# Patient Record
Sex: Male | Born: 1965 | Race: Black or African American | Hispanic: No | Marital: Married | Smoking: Current every day smoker
Health system: Southern US, Community
[De-identification: ages and names within clinical notes are randomized; demographics above are authoritative.]

## PROBLEM LIST (undated history)

## (undated) DIAGNOSIS — F1020 Alcohol dependence, uncomplicated: Secondary | ICD-10-CM

## (undated) DIAGNOSIS — K4090 Unilateral inguinal hernia, without obstruction or gangrene, not specified as recurrent: Secondary | ICD-10-CM

## (undated) DIAGNOSIS — C801 Malignant (primary) neoplasm, unspecified: Secondary | ICD-10-CM

## (undated) DIAGNOSIS — K4021 Bilateral inguinal hernia, without obstruction or gangrene, recurrent: Secondary | ICD-10-CM

## (undated) DIAGNOSIS — J189 Pneumonia, unspecified organism: Secondary | ICD-10-CM

## (undated) DIAGNOSIS — I10 Essential (primary) hypertension: Secondary | ICD-10-CM

## (undated) DIAGNOSIS — F1011 Alcohol abuse, in remission: Secondary | ICD-10-CM

---

## 2000-06-06 ENCOUNTER — Emergency Department (HOSPITAL_COMMUNITY): Admission: EM | Admit: 2000-06-06 | Discharge: 2000-06-06 | Payer: Self-pay | Admitting: Emergency Medicine

## 2003-05-06 ENCOUNTER — Emergency Department (HOSPITAL_COMMUNITY): Admission: EM | Admit: 2003-05-06 | Discharge: 2003-05-06 | Payer: Self-pay

## 2003-12-19 ENCOUNTER — Emergency Department (HOSPITAL_COMMUNITY): Admission: EM | Admit: 2003-12-19 | Discharge: 2003-12-19 | Payer: Self-pay | Admitting: Emergency Medicine

## 2004-04-28 ENCOUNTER — Emergency Department (HOSPITAL_COMMUNITY): Admission: EM | Admit: 2004-04-28 | Discharge: 2004-04-29 | Payer: Self-pay | Admitting: Emergency Medicine

## 2004-08-16 ENCOUNTER — Emergency Department (HOSPITAL_COMMUNITY): Admission: EM | Admit: 2004-08-16 | Discharge: 2004-08-17 | Payer: Self-pay | Admitting: Emergency Medicine

## 2006-08-27 ENCOUNTER — Inpatient Hospital Stay (HOSPITAL_COMMUNITY): Admission: EM | Admit: 2006-08-27 | Discharge: 2006-09-11 | Payer: Self-pay | Admitting: Family Medicine

## 2006-08-28 ENCOUNTER — Ambulatory Visit: Payer: Self-pay | Admitting: Thoracic Surgery

## 2006-08-30 ENCOUNTER — Encounter: Payer: Self-pay | Admitting: Cardiothoracic Surgery

## 2006-09-02 DIAGNOSIS — Z9889 Other specified postprocedural states: Secondary | ICD-10-CM | POA: Insufficient documentation

## 2006-09-13 ENCOUNTER — Ambulatory Visit: Payer: Self-pay | Admitting: *Deleted

## 2006-09-21 ENCOUNTER — Ambulatory Visit: Payer: Self-pay | Admitting: Cardiothoracic Surgery

## 2006-09-21 ENCOUNTER — Encounter: Admission: RE | Admit: 2006-09-21 | Discharge: 2006-09-21 | Payer: Self-pay | Admitting: Cardiothoracic Surgery

## 2006-09-26 ENCOUNTER — Encounter: Admission: RE | Admit: 2006-09-26 | Discharge: 2006-09-26 | Payer: Self-pay | Admitting: Cardiothoracic Surgery

## 2006-09-26 ENCOUNTER — Ambulatory Visit: Payer: Self-pay | Admitting: Cardiothoracic Surgery

## 2006-10-03 ENCOUNTER — Ambulatory Visit: Payer: Self-pay | Admitting: Internal Medicine

## 2006-11-09 ENCOUNTER — Encounter: Admission: RE | Admit: 2006-11-09 | Discharge: 2006-11-09 | Payer: Self-pay | Admitting: Cardiothoracic Surgery

## 2006-11-09 ENCOUNTER — Ambulatory Visit: Payer: Self-pay | Admitting: Cardiothoracic Surgery

## 2006-12-05 DIAGNOSIS — J869 Pyothorax without fistula: Secondary | ICD-10-CM | POA: Insufficient documentation

## 2006-12-05 DIAGNOSIS — F1010 Alcohol abuse, uncomplicated: Secondary | ICD-10-CM | POA: Insufficient documentation

## 2006-12-05 DIAGNOSIS — F17200 Nicotine dependence, unspecified, uncomplicated: Secondary | ICD-10-CM | POA: Insufficient documentation

## 2006-12-17 ENCOUNTER — Ambulatory Visit: Payer: Self-pay | Admitting: Cardiothoracic Surgery

## 2007-01-24 ENCOUNTER — Encounter: Payer: Self-pay | Admitting: Internal Medicine

## 2007-10-02 ENCOUNTER — Ambulatory Visit: Payer: Self-pay | Admitting: Critical Care Medicine

## 2007-10-02 ENCOUNTER — Inpatient Hospital Stay (HOSPITAL_COMMUNITY): Admission: EM | Admit: 2007-10-02 | Discharge: 2007-10-04 | Payer: Self-pay | Admitting: Emergency Medicine

## 2007-10-31 IMAGING — CR DG CHEST 2V
2 series · 2 of 2 positions shown · non-contrast
Comparison: [HOSPITAL] chest x-ray, 09/11/06.

CLINICAL DATA: Shortness of breath post left thoracotomy/empyema.
CHEST - TWO VIEWS:

[w chest pa]
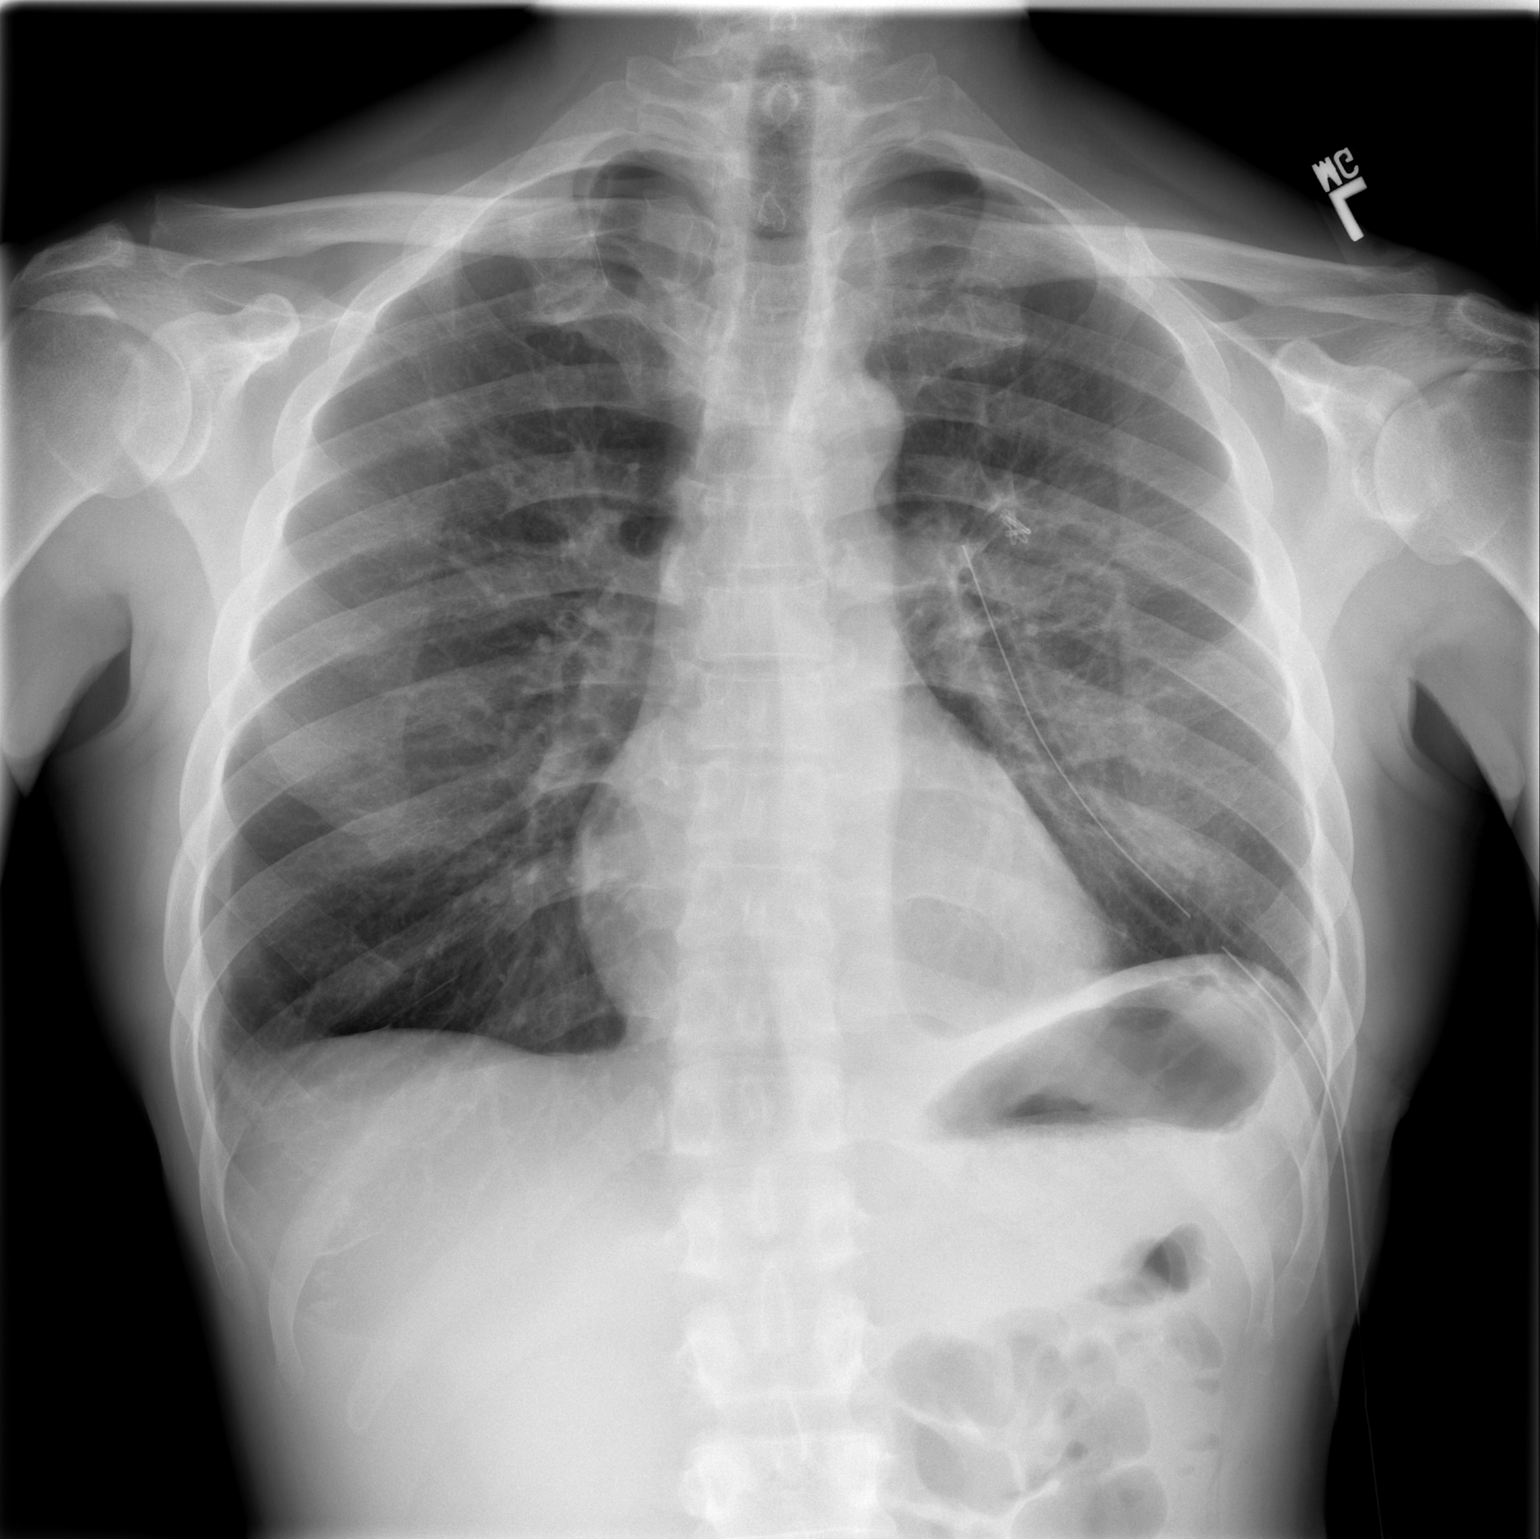

[w chest lat]
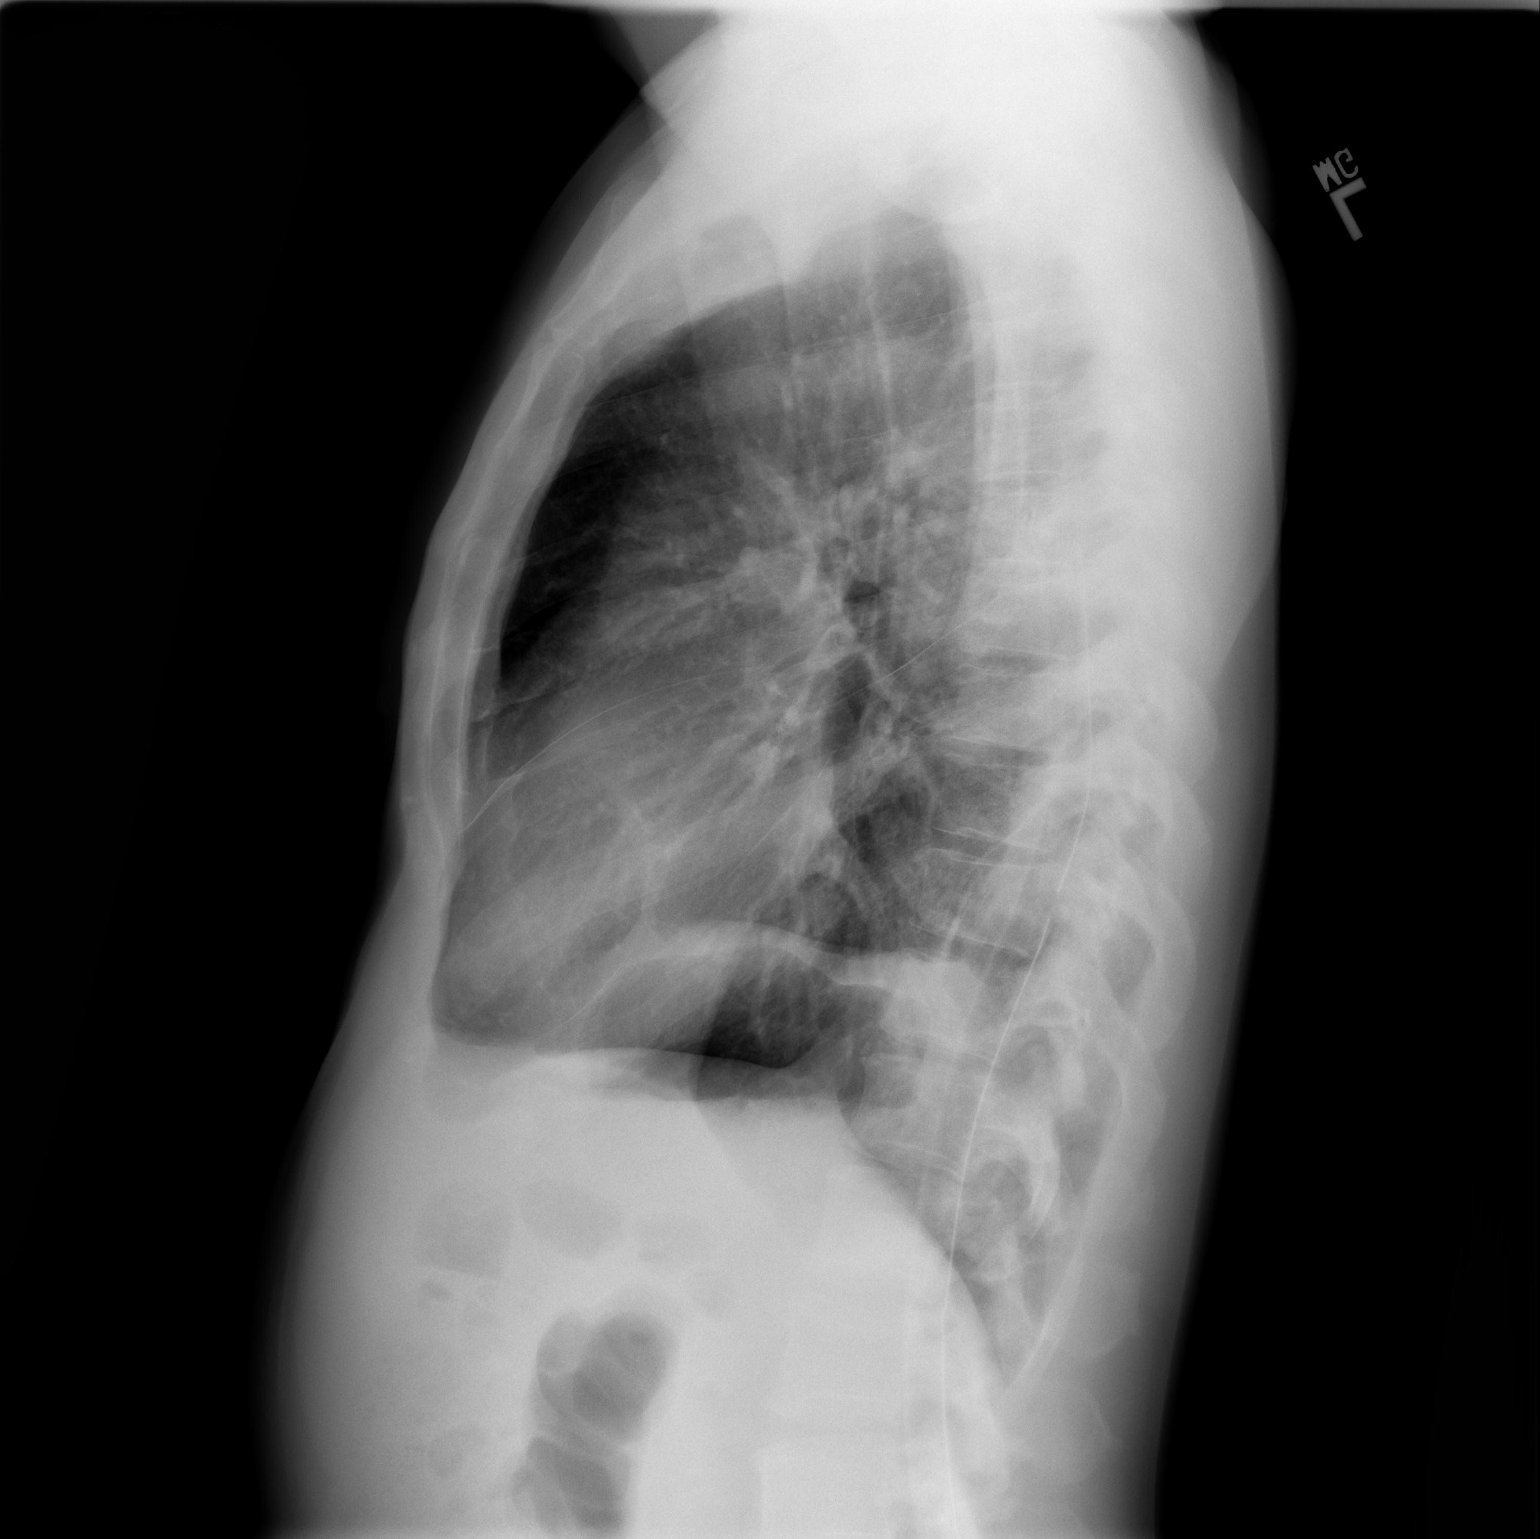

[2 of 2 positions shown; findings below may reference images not displayed]

FINDINGS: Stable left chest tube position is seen with left posterior upper lobe surgical clips.  Vague sheet-like opacity at the left hemithorax is seen with lungs otherwise clear without pneumothorax.  Interval clearing is seen at left lung base specifically.  Right lung clear.  Heart size normal.
IMPRESSION: 1.  Interval clearing, left lower lobe.
2.  Stable left hemithoracic postoperative changes and probable mild diffuse pleural thickening without significant pleural fluid nor pneumothorax.
3.  Otherwise no active disease.

## 2007-11-05 IMAGING — CR DG CHEST 2V
2 series · 2 of 2 positions shown · non-contrast
Comparison: 09/21/2006.

Exam: Chest, 2 views.

HISTORY: Status post left thoracotomy for empyema.

[w chest pa]
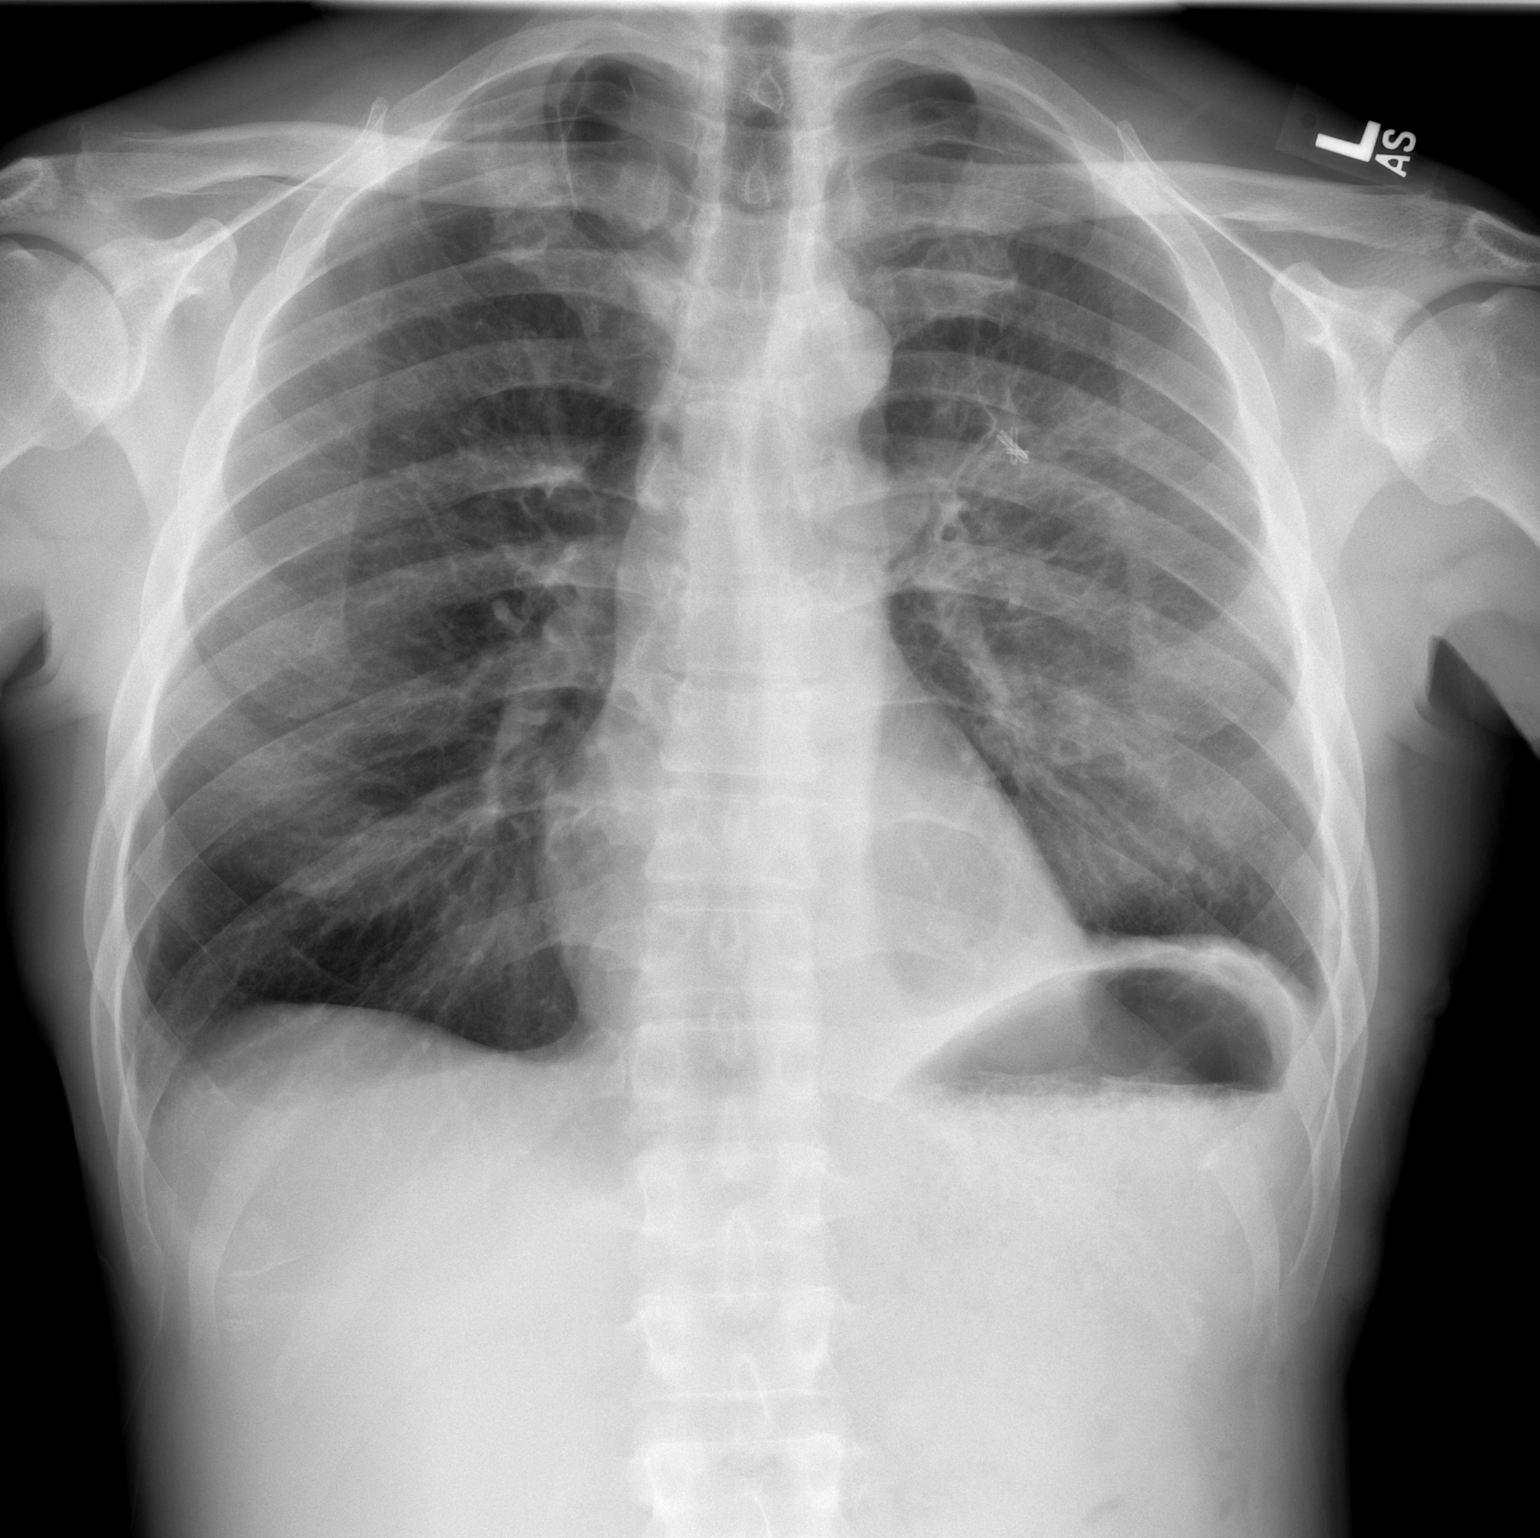

[w chest lat]
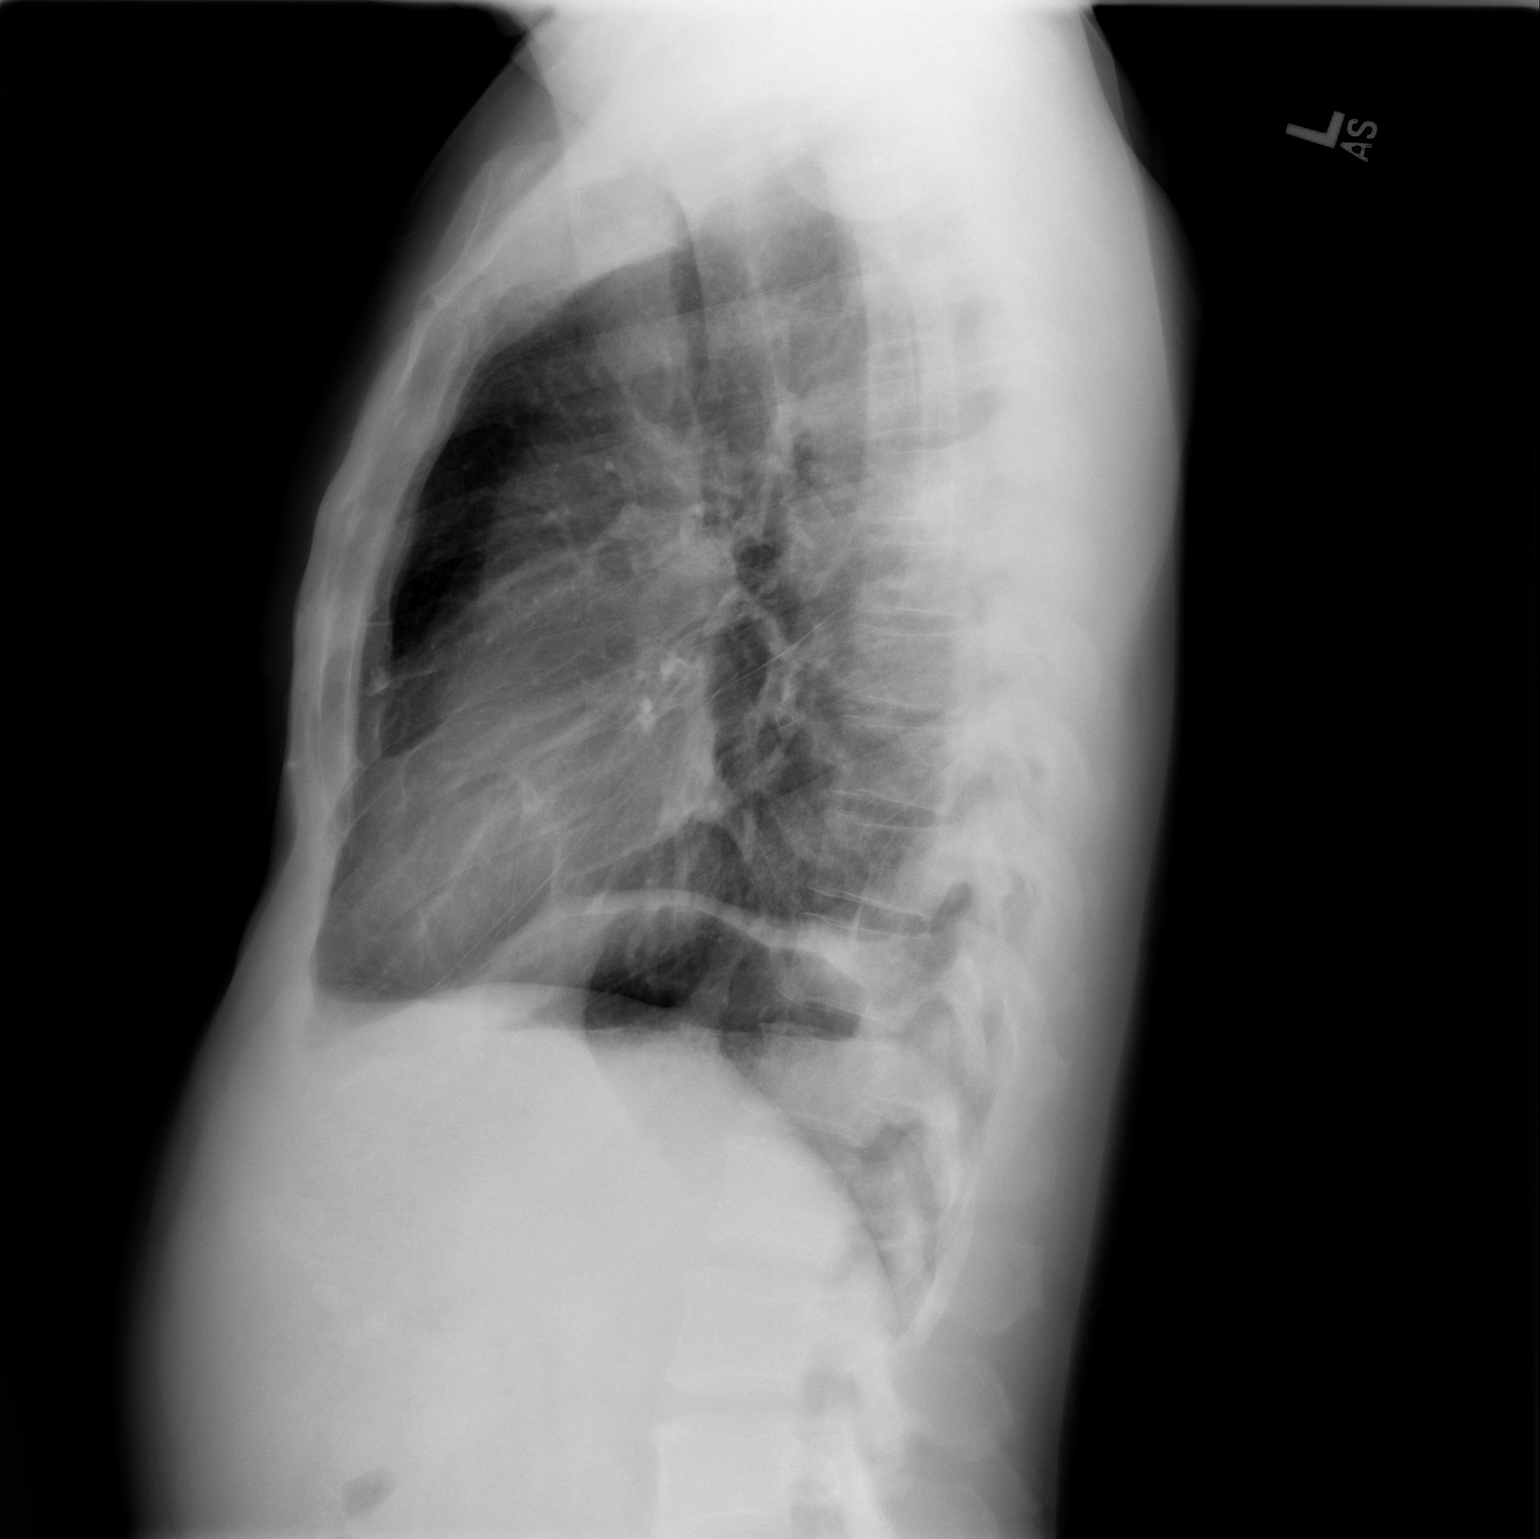

[2 of 2 positions shown; findings below may reference images not displayed]

FINDINGS: Heart size is normal.

Since the prior exam is no interval removal of left-sided chest tube. There is
no pneumothorax identified.

Small amount of pleural fluid versus pleural thickening is identified
bilaterally. This is similar in appearance to the prior exam.

A surgical clip is identified within the left upper lobe. 

No acute air space opacities identified.
IMPRESSION: 1. Mild residual pleural fluid/thickening.

## 2008-12-03 ENCOUNTER — Emergency Department (HOSPITAL_COMMUNITY): Admission: EM | Admit: 2008-12-03 | Discharge: 2008-12-03 | Payer: Self-pay | Admitting: Emergency Medicine

## 2009-03-27 HISTORY — PX: LUNG SURGERY: SHX703

## 2010-05-19 ENCOUNTER — Inpatient Hospital Stay (INDEPENDENT_AMBULATORY_CARE_PROVIDER_SITE_OTHER)
Admission: RE | Admit: 2010-05-19 | Discharge: 2010-05-19 | Disposition: A | Discharge status: Home / Self Care | Payer: Self-pay | Source: Ambulatory Visit | Attending: Family Medicine | Admitting: Family Medicine

## 2010-05-19 DIAGNOSIS — A64 Unspecified sexually transmitted disease: Secondary | ICD-10-CM

## 2010-07-01 LAB — DIFFERENTIAL
Basophils Absolute: 0 10*3/uL (ref 0.0–0.1)
Basophils Relative: 0 % (ref 0–1)
Eosinophils Relative: 0 % (ref 0–5)
Lymphocytes Relative: 10 % — ABNORMAL LOW (ref 12–46)
Neutro Abs: 7 10*3/uL (ref 1.7–7.7)

## 2010-07-01 LAB — URINALYSIS, ROUTINE W REFLEX MICROSCOPIC
Glucose, UA: 100 mg/dL — AB
Ketones, ur: 15 mg/dL — AB
pH: 5.5 (ref 5.0–8.0)

## 2010-07-01 LAB — HEPATIC FUNCTION PANEL
AST: 44 U/L — ABNORMAL HIGH (ref 0–37)
Bilirubin, Direct: 0.1 mg/dL (ref 0.0–0.3)
Total Bilirubin: 1 mg/dL (ref 0.3–1.2)

## 2010-07-01 LAB — POCT CARDIAC MARKERS: Myoglobin, poc: 118 ng/mL (ref 12–200)

## 2010-07-01 LAB — BASIC METABOLIC PANEL
BUN: 6 mg/dL (ref 6–23)
CO2: 29 mEq/L (ref 19–32)
Calcium: 9.7 mg/dL (ref 8.4–10.5)
Creatinine, Ser: 0.89 mg/dL (ref 0.4–1.5)
GFR calc Af Amer: >60 mL/min (ref >60)

## 2010-07-01 LAB — D-DIMER, QUANTITATIVE: D-Dimer, Quant: 0.63 ug/mL-FEU — ABNORMAL HIGH (ref 0.00–0.48)

## 2010-07-01 LAB — CBC
MCHC: 34.6 g/dL (ref 30.0–36.0)
MCV: 92.7 fL (ref 78.0–100.0)
Platelets: 129 10*3/uL — ABNORMAL LOW (ref 150–400)
RBC: 5.14 MIL/uL (ref 4.22–5.81)
RDW: 13.2 % (ref 11.5–15.5)

## 2010-07-01 LAB — URINE CULTURE

## 2010-07-01 LAB — RAPID URINE DRUG SCREEN, HOSP PERFORMED
Amphetamines: NOT DETECTED
Barbiturates: NOT DETECTED
Benzodiazepines: NOT DETECTED
Cocaine: NOT DETECTED

## 2010-07-01 LAB — LIPASE, BLOOD: Lipase: 33 U/L (ref 11–59)

## 2010-07-01 LAB — URINE MICROSCOPIC-ADD ON

## 2010-08-09 NOTE — Discharge Summary (Signed)
NAMEWILBON, Jonathon Hunt                ACCOUNT NO.:  [id number]   MEDICAL RECORD NO.:  [id number]          PATIENT TYPE:  INP   LOCATION:  2002                         FACILITY:  [facility]   PHYSICIAN:  Isidor Holts, M.D.  DATE OF BIRTH:  16-Apr-1965   DATE OF ADMISSION:  08/26/2006  DATE OF DISCHARGE:  09/11/2006                               DISCHARGE SUMMARY   ADDENDUM:  For discharge diagnoses and procedures, as well as  consultations, refer to interim discharge summary dated September 09, 2006.  In addition, for the subsequent period from September 10, 2006 to September 11, 2006, the patient's clinical condition remained stable. There were no  new issues. He has been seen on September 11, 2006 by Dr. Donata Clay, TCTS,  and has been okayed from the point of view of cardiothoracic surgery, to  be discharged. Certainly clinically, he has made impressive progress and  is practically recovered. The patient has therefore been discharged.   DISCHARGE MEDICATIONS:  1. Augmentin 875 mg p.o. b.i.d. for 6 weeks.  2. Thiamine 100 mg p.o. daily.  3. Percocet (5/325) 1 p.o. p.r.n. q.6h.; a total of 40 pills have been      dispensed.   DIET:  No restrictions.   ACTIVITY:  Recommended to increase activity slowly.   FOLLOWUP INSTRUCTIONS:  The patient is recommended to follow up with Dr.  Donata Clay, TCTS, after one week. He is also recommended to establish and  follow up with a primary M.D. for routine preventative care.   SPECIAL INSTRUCTIONS:  Home health PT/OT R.N. has been arranged. The  patient has been recommended to remain off work until reevaluated and  okayed by Dr. Donata Clay, TCTS. All of this has been communicated to  patient who verbalized understanding.      Isidor Holts, M.D.  Electronically Signed     CO/MEDQ  D:  09/11/2006  T:  09/11/2006  Job:  [id number]   cc:   Kerin Perna, M.D.

## 2010-08-09 NOTE — Assessment & Plan Note (Signed)
OFFICE VISIT   THEOPOLIS, SLOOP  DOB:  [birth year]                                        September 21, 2006  CHART #:  [id number]   CURRENT PROBLEMS:  1. Left empyema status post left VATS and drainage with decortication      on 08/30/2006.  2. Chronic obstructive pulmonary disease.   CURRENT MEDICATIONS:  1. Augmentin 875 mg p.o. b.i.d.  2. Thiamine 100 mg daily.  3. Percocet p.r.n. pain.   PRESENT ILLNESS:  The patient returns for an office visit. He was  discharged from the hospital with an empyema tube intact connected to a  mini express drainage system. He has had no fever and has had usual post-  thoracotomy pain. He is eating well and is gaining strength. The chest  tube had minimal drainage over the ten days and I see no air leak on  exam.   PHYSICAL EXAMINATION:  Vital Signs: Blood pressure 130/60, pulse 80,  respirations 18, saturation 98%. Chest: Breath sounds are clear. The  left thoracotomy incision is well healed. The left chest tube is removed  and a compressive dressing applied. There is good breath sounds  following removal of the chest tube. Cardiac rhythm is regular.  Neurologic: No deficit.   LABORATORY DATA:  Lateral chest x-ray prior to the office visit today  shows clear lung fields, no effusion or residual infiltrate.   IMPRESSION:  Improved course following decortication and drainage of  empyema. He will finish his oral antibiotics and I will see him back in  five days with a chest x-ray, and to remove the chest tube sutures.   Kerin Perna, M.D.  Electronically Signed   PV/MEDQ  D:  09/21/2006  T:  09/22/2006  Job:  [id number]

## 2010-08-09 NOTE — Consult Note (Signed)
NAMESUSANO, CLECKLER                ACCOUNT NO.:  [id number]   MEDICAL RECORD NO.:  [id number]          PATIENT TYPE:  INP   LOCATION:  5731                         FACILITY:  [facility]   PHYSICIAN:  Ines Bloomer, M.D. DATE OF BIRTH:  1966/02/02   DATE OF CONSULTATION:  DATE OF DISCHARGE:                                 CONSULTATION   CHIEF COMPLAINT:  Left chest pain.   HISTORY OF PRESENT ILLNESS:  A 45 year old patient started developing  three to four weeks of fever and chills and left chest pain with a  chronic cough.  He also had some decreased appetite and weight loss.  He  went to urgent care after using over-the-counter medications.  A chest x-  ray revealed an area of elevated white count and opacified left chest.  He was admitted to the hospital, underwent a CT scan which shows a  loculated left pleural effusion with two areas of loculation.  His white  count is 16,000.  He denies no fever, chills, nausea or vomiting.   PAST MEDICAL HISTORY:  Unremarkable.  He apparently had a needle removed  from one toe.   He is on no medications, no allergies on admission, now on Lovenox,  Vancomycin and Fortaz.   FAMILY HISTORY:  Positive for cancer and vascular disease.   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   REVIEW OF SYSTEMS:  He has had some weight loss.  CARDIAC:  No angina or  atrial fibrillation.  PULMONARY:  No hemoptysis.  See History of Present  Illness.  GI:  No nausea, vomiting, constipation or diarrhea.  GU:  No  dysuria, frequent urination or kidney disease.  VASCULAR:  No  claudication, DVT, TIAs.  NEUROLOGICAL:  No headaches, blackouts or  seizures.  MUSCULOSKELETAL:  No joint pain, rash.  HEMATOLOGIC:  No  problems with kidney disease or bleeding.  ENDOCRINE:  No history of  adrenal insufficiency or thyroid disease.  PSYCHIATRIC:  No psychiatric  illnesses.   PHYSICAL EXAMINATION:  VITAL SIGNS:  His temp is 100, sats are 99%,  blood pressure is 118/17, pulse 100, respirations 20.  HEENT:  Unremarkable.  NECK:  Supple without thyromegaly.  There is no supraclavicular axillary  adenopathy.  CHEST:  Decreased breath sounds on the left.  HEART:  Regular sinus rhythm.  No murmurs.  ABDOMEN:  Soft.  There is no hepatosplenomegaly.  Bowel sounds are  normal.  EXTREMITIES:  Pulses 2+.  There is no clubbing or edema.  NEUROLOGIC:  Intact.  Oriented x3.   IMPRESSION:  1. Loculated left pleural effusion, probable empyema.  2. Probable pneumonia.  3. History of tobacco abuse.   PLAN:  Culture and drainage with a VATS approach.      Ines Bloomer, M.D.  Electronically Signed     DPB/MEDQ  D:  08/28/2006  T:  08/28/2006  Job:  [id number]

## 2010-08-09 NOTE — Discharge Summary (Signed)
Jonathon Hunt, Jonathon Hunt                ACCOUNT NO.:  [id number]   MEDICAL RECORD NO.:  [id number]          PATIENT TYPE:  INP   LOCATION:  5149                         FACILITY:  [facility]   PHYSICIAN:  Nelda Bucks, MD DATE OF BIRTH:  18-Jul-1965   DATE OF ADMISSION:  10/02/2007  DATE OF DISCHARGE:  10/04/2007                               DISCHARGE SUMMARY   DISCHARGE DIAGNOSES:  1. Acute alcohol intoxication with acute respiratory event requiring      intubation.  2. Altered mental status.  3. History of empyema and drainage with left VATS June 2008.   HISTORY OF PRESENT ILLNESS:  Mr. Jonathon Hunt is a 45 year old African  American male who presented to the emergency department combative.  He  required sedation and intubation for radiographic evaluation secondary  to multiple abrasions about his face.  Pulmonary critical care was asked  to admit at that time.   LAB DATA:  Chest x-ray on July 10 demonstrates extubation and removal of  NG tube with mid left air space disease improved.  CT of the abdomen,  pelvis, cervical spine demonstrates mild generalized cerebral and  cerebellar blood loss.  No acute intracranial abnormalities.  Further  lab data, hemoglobin 14.4, hematocrit 41.6, WBCs 15.5, platelets are  156, sodium 134, potassium 3.6, chloride 99, CO2 26, BUN 5, creatinine  0.86, glucose is 104.  AST is 99, ALT is 84, alkaline phosphatase is 50,  total bilirubin 0.7, albumin 3.8, calcium 9.2, magnesium 2.3.  Acetaminophen level is less than 10.  Alcohol level on July 9 was 83 and  on day of admission was 549.  Drugs of abuse were negative.  Arterial  blood gas pH 7.43, pCO2 of 41, pO2 of 513 on 100% FIO2.   HOSPITAL COURSE BY DISCHARGE DIAGNOSES:  1. Acute respiratory failure secondary to altered mental status with      alcohol level 549 and with IV Ativan.  He required orotracheal      intubation, successful liberation from mechanical ventilatory      support within 18  hours.  He has no further respiratory difficulty.      His combative behavior has resolved.  2. Altered mental status.  Altered mental status resolved.  CT      evaluation of his head, neck and abdomen and pelvis was negative.      No further interventions required.  3. History of empyema and drainage with a left VATS June 2008.  No      interventions required.  4. Right facial abrasions secondary to fall.  Again, all radiographic      information is negative.  He has been instructed to use Neosporin      to his abrasions.  5. Alcohol abuse.  It has been suggested that he seek help with his      alcohol problem.  This would be left up to him at this time.   DISCHARGE MEDICATIONS:  None.   DIET:  Regular.   DISPOSITION AND CONDITION ON DISCHARGE:  His acute respiratory failure  and alcohol overload has been resolved.  He is being discharged  improved.      Devra Dopp, MSN, ACNP      Nelda Bucks, MD  Electronically Signed    SM/MEDQ  D:  10/04/2007  T:  10/04/2007  Job:  [id number]

## 2010-08-09 NOTE — Assessment & Plan Note (Signed)
OFFICE VISIT   Jonathon Hunt, Jonathon Hunt  DOB:  16-Nov-1965                                        September 26, 2006  CHART #:  [id number]   CURRENT PROBLEMS:  1. Left lung pneumonia with empyema requiring thoracotomy and      decortication August 30, 2006.  2. Prolonged air leak with home chest tube - mini express system for      10 days now, discontinued.  3. Chronic obstructive pulmonary disease.   CURRENT MEDICATIONS:  1. Augmentin 875 mg p.o. b.i.d., finishing a 14-day course.  2. Thiamin 100 mg daily.  3. Percocet p.r.n. pain.   HISTORY OF PRESENT ILLNESS:  The patient is a 45 year old gentleman who  returns for an office visit.  His chest tubes have been removed and he  has almost finished his course of antibiotics.  He does not smoke now  and he has been eating and walking.  He denies fever, productive cough,  or weight loss.   PHYSICAL EXAMINATION:  He is afebrile, blood pressure 130/80, pulse 84  and regular, respirations 18, saturation 99% on room air.  His breath  sounds are clear and equal.  The thoracotomy incision and chest tube  sites are well-healed.   A PA and lateral chest x-ray shows no residual pleural effusion with  minimal pleural thickening.   IMPRESSION:  Patient has had a good response to the thoracotomy and  decortication.  He can start doing routine daily activities but not  heavy lifting or working.  I gave him a note that he  should not return to work until mid-August.  I will see him back in mid-  August for chest x-ray and for a final exam.   Kerin Perna, M.D.  Electronically Signed   PV/MEDQ  D:  09/26/2006  T:  09/27/2006  Job:  [id number]

## 2010-08-09 NOTE — Assessment & Plan Note (Signed)
OFFICE VISIT   MISAEL, MCGAHA  DOB:  [birth year]                                        November 09, 2006  CHART #:  [id number]   CURRENT PROBLEMS:  1. Left empyema requiring left thoracotomy decortication, August 30, 2006.  2. Prolonged air leak postoperatively, treated with a mini-express      chest tube which was discontinued in early July.   HISTORY OF PRESENT ILLNESS:  Mr. Slomski returns for his final check and  chest x-ray after undergoing decortication for his significant empyema.  He is feeling well, denies fever.  He still has some post thoracotomy  incisional pain.  He denies cough.   EXAMINATION:  Blood pressure 160/90, pulse 80, respirations 18,  saturation 99%.  Breath sounds are clear and equal. The thoracotomy  incision is well-healed. Cardiac rhythm is regular without gallop or  murmur.   A PA and lateral chest x-ray reveals no pleural effusion, thickening or  pulmonary infiltrates.  There is no pneumothorax.   IMPRESSION AND PLAN:  The patient has recovered well from the  thoracotomy decortication 10 weeks ago.  He will return to his normal  activity level and return as needed.  I have provided one more prescription of Percocet #30 tablets and he  understands that after that he should take Tylenol, Advil or other over-  the-counter products.   Kerin Perna, M.D.  Electronically Signed   PV/MEDQ  D:  11/09/2006  T:  11/09/2006  Job:  [id number]

## 2010-08-09 NOTE — H&P (Signed)
NAMEKIER, SMEAD                ACCOUNT NO.:  [id number]   MEDICAL RECORD NO.:  [id number]          PATIENT TYPE:  INP   LOCATION:  5731                         FACILITY:  [facility]   PHYSICIAN:  Hettie Holstein, D.O.    DATE OF BIRTH:  September 08, 1965   DATE OF ADMISSION:  08/26/2006  DATE OF DISCHARGE:                              HISTORY & PHYSICAL   CHIEF COMPLAINT:  Coughing and chest discomfort.   HISTORY OF PRESENT ILLNESS:  Mr. Crutchfield is a pleasant, 45 year old male  with relatively unremarkable past medical history with denial of history  of hypertension, diabetes or other chronic health problems.  He  developed coughing 3 weeks ago that persisted with on and off fevers and  chills.  He  develop initially bilateral chest pain that progressed to  more left-sided with a pleuritic component and sitting up of whitish and  yellowish sputum.  He said that this persisted and then he started to  notice decrease in his appetite and some subjective weight loss.  He  stated his baseline weight is typically 171-172.  He.  Went to [facility] to seek help as he had been only taking over-the-counter treatment  such as Theraflu without relief.  [facility], he was  discovered to have an opacified left lung on chest x-ray and elevated  white count.  He is being further admitted for treatment and evaluation.   PAST MEDICAL HISTORY:  Relatively unremarkable.  He had a surgery for a  needle in his toe, otherwise he retains his gallbladder and appendix, no  known heart disease, diabetes or lung disease that he is aware of.   MEDICATIONS:  No medications at home.   ALLERGIES:  NO KNOWN DRUG ALLERGIES.   SOCIAL HISTORY:  Works as a Public affairs consultant.  He quit tobacco about a week  ago with one and a half to one pack of cigarettes per day.  He drinks  about two to three shots per day.  He has three children.  He is not  married.   FAMILY HISTORY:  Mother died at age 95 with a stroke.  Father  died at 23  with lung cancer and some complications of tuberculosis.  His brother is  his closest contact person reachable at [phone].   REVIEW OF SYSTEMS:  As noted above, he has had a poor appetite,  subjective weight loss and cold extremities with some loose stools.  Otherwise, further review of systems unremarkable.   PHYSICAL EXAMINATION:  VITAL SIGNS:  Blood pressure was 121/81, heart  rate 108, O2 saturations 99% on room air, temperature 102.2.  HEENT:  Head to be normocephalic, atraumatic.  His affect is flat.  NECK:  Supple, nontender.  No palpable thyromegaly or mass.  CARDIAC:  Normal S1, S2, slight tachycardic.  No appreciable murmur.  LUNGS:  Diminished breath sounds on left and exhibited normal effort.  There was dullness to percussion over the entire left lung field.  ABDOMEN:  Soft and nontender.  No rebound or guarding.  EXTREMITIES:  Lower extremities with no edema, cyanosis or clubbing.  NEUROLOGIC:  Exam as noted above.  He was euthymic.  His affect was  flat.  No focal deficits on exam.   LABORATORY DATA:  I-stat revealed a sodium of 125, potassium 3.1, BUN  28, creatinine 0.9, glucose 93 and bicarb 31.  Hemoglobin was 10.4, WBC  of 16.7, MCV of 80 and platelet count 379.   ASSESSMENT:  1. Left-sided pneumonia unable to rule out malignancy due to the      obscuring nature of the opacification.  2. Alcohol dependence.  3. Weight loss.  4. Tobacco abuse.  5. Anorexia.  6. Hypokalemia.  7. Hyponatremia.  8. Microcytic anemia.   PLAN:  We are going to admit Mr. Watrous for further diagnostic workup.  He will need a CT scan to assist in deciding whether to pursue after  this either diagnostic thoracentesis versus a CVTS consultation  depending on what this reveals.  He will be placed empirically on broad-  spectrum antibiotics and he will have his electrolytes repleted as  indicated.  Hopefully, this is simply an undertreated pneumonia or  pneumonia that has  progressed due to late seeking of medical advice. We  will await CT and blood cultures.      Hettie Holstein, D.O.  Electronically Signed     ESS/MEDQ  D:  08/27/2006  T:  08/27/2006  Job:  [id number]

## 2010-08-09 NOTE — Discharge Summary (Signed)
NAMEJAZIR, Jonathon Hunt                ACCOUNT NO.:  [id number]   MEDICAL RECORD NO.:  [id number]          PATIENT TYPE:  INP   LOCATION:  2002                         FACILITY:  [facility]   PHYSICIAN:  Isidor Holts, M.D.  DATE OF BIRTH:  [birth year]   DATE OF ADMISSION:  08/26/2006  DATE OF DISCHARGE:                               DISCHARGE SUMMARY   DATE OF DISCHARGE:  To be determined.   DISCHARGE DIAGNOSES:  1. Fusobacterium nucleatum pneumonia left lung.  2. Left empyema, secondary to #1 above, status post video-assisted      thoracoscopic procedure/mini thoracotomy/decortication August 30, 2006.  3. History of alcohol excess.  4. Smoking history.   DISCHARGE MEDICATIONS:  These will be listed in addendum at the time of  actual discharge by discharging M.D.   PROCEDURES:  1. Two-view chest x-ray dated August 26, 2006.  This showed abnormal      study with expansile opacification of the left lower lobe and an      associated left pleural effusion.  Findings appeared consistent      with central neoplastic process or atypical pneumonia and possible      empyema, chest CT scan recommended.  2. Chest CT scan dated August 27, 2006.  This showed large loculated      fluid collection in the posterior lower left hemithorax, probably      within lung parenchyma, although we cannot completely exclude that      this is a pleural-based process.  A second small air fluid      collection is seen just caudal to this and the smaller collection      is probably pleural-based.  There was borderline abdominal      lymphadenopathy.  3. Chest x-ray dated August 29, 2006.  This showed no pneumothorax, no      change in opacity in the left mid lung and left lung base.  4. Ultrasound-guided thoracocentesis August 29, 2006, performed by Dr.      Jolaine Click, Interventional Radiologist.  This was a successful      procedure, yielded 85 mL of thick green liquid which was submitted      to laboratory for  preordered studies.  5. Chest x-ray dated August 30, 2006.  This showed diffuse airspace      consolidation of the left lung postoperatively, no pneumothorax.      Central line tip lies in the upper SVC.  6. Chest x-ray dated August 31, 2006.  This showed two left chest tubes      remain with little change to slightly better aeration on the left.  7. Chest x-ray dated September 01, 2006. This was negative for pneumothorax.      There was decreasing effusion and distended loops of bowel,      probably ileus.  8. Chest x-ray dated September 02, 2006.  This showed stable support      apparatus, no pneumothorax is seen, slight improved lung aeration,      pulmonary edema and atelectasis and small effusion on the left.  9. Chest x-ray dated September 03, 2006.  This showed no change, no evidence      of pneumothorax.  10.Chest x-ray dated September 04, 2006.  This showed stable chest, no      evidence of pneumothorax.  11.Chest x-ray dated September 05, 2006.  This showed stable support      apparatus, left mid and lower lung zone airspace disease, slight      improved aeration in the left upper lobe, no pneumothorax.  12.Chest x-ray dated September 06, 2006.  This showed removal of one of the      left chest tubes without a large pneumothorax, residual      interstitial densities throughout the left mid and lower lung      region.  13.Chest x-ray dated September 07, 2006.  This showed left chest tube in      place with increased lucency in the left base which could represent      basilar pneumothorax, there was improved aeration in the left lung.  14.Chest x-ray dated September 08, 2006.  This showed continued improving      left lower lung aeration, otherwise stable appearance since      previous exam.  15.Chest x-ray dated September 09, 2006.  This showed no significant      change.   CONSULTATIONS:  1. Dr. Ines Bloomer, TCTS.  2. Dr. Maren Beach, TCTS.  3. Dr. Jolaine Click, Interventional Radiologist.   ADMISSION HISTORY:  As in H&P  notes of August 26, 2006, dictated by Dr.  Hannah Beat.  However, in brief, this is a 45 year old male, with no  significant past medical history, who presents with a 3-week history of  cough and persistent bilateral chest discomfort associated with  recurrent pyrexia, chills, chest pains with a decidedly left sided  pleuritic component and phlegm was whitish/yellowish.  In addition,  patient had experienced diminution of appetite and progressive weight  loss.  As over-the-counter medications such as TheraFlu proved  unhelpful, he went to the [facility] where he had the chest x-  ray, which demonstrated an opacified left lung, as well as elevated  white cell count on CBC.  He was therefore referred for admission,  further evaluation, investigation, management.   CLINICAL COURSE:  1. Left pneumonia complicated by empyema.  For details of      presentation, refer to admission history above.  Initial imaging      studies demonstrated striking abnormalities in the left hemithorax      as well as an associated pleural effusion, raising suspicion of      empyema.  These findings were confirmed by followup chest CT scan      which, in addition, showed some loculation on the left.  Patient      underwent ultrasound-guided thoracocentesis with evacuation of 85      mL of thick green fluid on August 29, 2006.  Procedure was performed      by Dr. Jolaine Click and was without complications.  A TCTS      consultation was kindly provided by Dr. Jovita Gamma.  For details      of consultation, please refer to his consultation notes of August 28, 2006.  Patient subsequently underwent left VATS and mini      thoracotomy for decortication and drainage of empyema and placement      of On-Q wound analgesia irrigation system by Dr. Lovett Sox on  August 30, 2006.  Patient at the time of presentation, had been      commenced on intravenous Rocephin and Azithromycin; however,     antibiotic coverage  was broadened to include Vancomycin,      subsequently.  As bacteriologic studies of pleural fluid aspirate      subsequently demonstrated gram-positive rods, which were      subsequently identified to be fusobacterium nucleatum, beta-      lactamase negative, antibiotics were changed to a combination of      Vancomycin and Zosyn.  As of September 06, 2006, patient had completed a      10-day course of Vancomycin, this was discontinued, and at the time      of this dictation on September 09, 2006, the patient was on day #10 of      Zosyn.  Following VATS procedure, the patient had two chest tubes      placed, one was removed on September 06, 2006, and followup x-rays      showed continued improvement in pulmonary aeration and opacity.      For details of this, kindly refer to procedure list above.  As of      September 09, 2006, the patient had remained afebrile, white cell count      had normalized to 11.7 from an admission WCC of 16.7, appetite had      greatly improved and patient was beginning to feel quite well.   1. History of alcohol excess.  The patient pre-admission, was      partaking of approximately 2-3 shots of alcohol per day.  He has      been counseled appropriately and placed on vitamin supplementation.      However, during the course of hospitalization he showed no alcohol      withdrawal phenomena.   1. Smoking history.  Patient admitted to smoking about 1 to 1-1/2      packets of cigarettes per day and apparently quit 1 week prior to      admission.  He has been counseled appropriately, he was managed      during the course of hospitalization with Nicoderm CQ patch.   DISPOSITION:  This will be elucidated in Addendum at the appropriate  time by discharging M.D.  However, patient has shown steady clinical  improvement during the course of his hospitalization and, as mentioned  above, he has remained afebrile, white cell count had practically  normalized as of September 09, 2006.  Patient's  well being, as well as  appetite have improved significantly.  He has been seen by Physical  Therapy/Occupational Therapy and does not show significant  deconditioning.  As of September 06, 2006, he had only one chest tube  remaining.  He as been followed up closely by the TCTS team and it is  anticipated that in the next few days, chest drain may be completely  discontinued; the decision as to the appropriate timing of this will, of  course, be deferred to TCTS.  The patient did require a 2-day course of  intravenous Lasix on September 02, 2007 to September 03, 2006, for pulmonary  interstitial congestion; however, following that, he has shown no  clinical evidence of heart failure.  He had a reactive thrombocytosis,  reaching a high of 672 on September 05, 2006.  However, this has trended down  with improvement in patient's infective process and by September 09, 2006,  platelet count was 582,000.  Patient has a  mild anemia which is deemed likely secondary to his infective condition.   LAB STUDIES SHOWED THE FOLLOWING FINDINGS:  Iron 44, % saturation 16,  TIBC 275, ferritin 429.  Hemoglobin has remained stable, however, and as  of September 09, 2006, hemoglobin was 11.1 with a hematocrit of 32.7.      Isidor Holts, M.D.  Electronically Signed     CO/MEDQ  D:  09/09/2006  T:  09/09/2006  Job:  [id number]

## 2010-08-09 NOTE — H&P (Signed)
Jonathon Hunt, Jonathon Hunt                ACCOUNT NO.:  [id number]   MEDICAL RECORD NO.:  [id number]          PATIENT TYPE:  INP   LOCATION:  3106                         FACILITY:  [facility]   PHYSICIAN:  Charlcie Cradle. Delford Field, MD, FCCPDATE OF BIRTH:  1965-04-04   DATE OF ADMISSION:  DATE OF DISCHARGE:                              HISTORY & PHYSICAL   This is a 45 year old African American male brought in acutely  intoxicated and combative.  He was given Haldol, this subsequently  resulted in respiratory failure.  He was also electively intubated.  He  is now waken up and agitated again.  He is on a vent.  Alcohol level of  500.  Asked to admit to the ICU.   PAST MEDICAL HISTORY:  Essentially negative except for empyema drainage  in June 2008.   PHYSICAL EXAMINATION:  VITAL SIGNS:  Blood pressure was initially  136/91; pulse 112; respirations 18 on the vent, on 30%; rate of 12, 600  tidal volume, PEEP of 0.  CHEST:  Distant breath sounds.  No wheeze or rhonchi.  CARDIAC:  Regular rate and rhythm, without S3.  Normal S1 and S2.  ABDOMEN:  Soft and nontender.  EXTREMITIES:  No edema or clubbing.  SKIN:  Clear.  NEUROLOGIC:  Intact.  The patient is acutely agitated earlier, though  was sedated.  No focal exam noted.   LABORATORY DATA:  Drug screen negative.  Alcohol level 549.  Urinalysis  negative.  PH 743, pCO2 41, and pO2 513.  Sodium 143, potassium 3.6,  chloride 104, CO2 26, BUN 8, creatinine 1, and blood sugar 105.  White  count 10.4, hemoglobin 15, and platelet count 199.  Chest x-ray was  clear.  Endotracheal tube was intact.  CT head negative.   IMPRESSION:  1. Acute alcohol intoxication with acute combative behavior.  2. Respiratory failure, precipitated with Haldol use.   RECOMMENDATIONS:  Administer Precedex drip.  Give Ativan in a short term  until Precedex can be obtained.  Maintain full vent support and extubate  when the patient's alcohol level clears.      Charlcie Cradle  Delford Field, MD, [facility]  Electronically Signed     PEW/MEDQ  D:  10/02/2007  T:  10/03/2007  Job:  [id number]

## 2010-08-09 NOTE — Op Note (Signed)
NAMEALIC, HILBURN                ACCOUNT NO.:  [id number]   MEDICAL RECORD NO.:  [id number]          PATIENT TYPE:  INP   LOCATION:  5731                         FACILITY:  [facility]   PHYSICIAN:  Kerin Perna, M.D.  DATE OF BIRTH:  Aug 31, 1965   DATE OF PROCEDURE:  08/30/2006  DATE OF DISCHARGE:                               OPERATIVE REPORT   OPERATION:  1. Left vats and mini thoracotomy for decortication and drainage of      empyema.  2. Placement of On-Q wound analgesia irrigation system.   SURGEON:  Kerin Perna, M.D.   ASSISTANT:  Coral Ceo, PA-C.   [place] AND POSTOPERATIVE DIAGNOSIS:  Empyema left chest.   ANESTHESIA:  General.   INDICATIONS:  The patient is a 45 year old gentleman with 3 weeks of  progressive fever, weight loss and chest pain.  A chest x-ray showed a  rounded density in the left chest and a CT scan showed this to be  loculated effusion which thoracentesis demonstrated to be pus.  He was  felt to be a candidate for left thoracotomy and drainage and  decortication of empyema.  I discussed the procedure with the patient  prior to surgery including the alternatives to surgery, the expected  recovery and postoperative complications of persistent infection, air  leak, pneumonia.  He understood these risks and implications and agreed  to proceed with surgery as planned.   PROCEDURE:  The patient was brought to operating room, placed supine on  the operating table.  General anesthesia was induced.  The chest was  prepped and draped after the patient was rolled to expose left chest  which was checked for proper site and proper patient.  A double-lumen  endotracheal tube were then placed.  As the patient was prepped and  draped, a small vats incision was made and there was thick foul-smelling  material in the left pleural space which was immediately cultured.  Visualization was poor and the vats incision was extended to a mini  thoracotomy in the sixth  interspace.  The ribs were gently spread but  not divided and the dissection and drainage of the empyema was then  begun.  It was a large posterior loculated collection which was yellow  and foul-smelling.  A thick visceral pleural peel was removed from the  posterior aspect of the left upper and left lower lobes.  This was  submitted for culture and pathology as well.  The lung was firmly  adherent anteriorly to the chest.  After the empyema peel had been  removed, the space was irrigated with copious amounts of warm saline as  well as antibiotic irrigation.  Two posterior chest tubes were placed  brought out through separate incisions and secured to the skin.  The  lung was then re-expanded.  The ribs were reapproximated with #2 Vicryl  pericostals.  The muscle layer was closed interrupted #1 Vicryl.  Subcutaneous was closed in running 2-0 Vicryl and skin was closed with  staples.   An On-Q wound irrigation catheter was then placed between the chest tube  sites and  the thoracotomy incision and secured to the skin and connected  to the Marcaine reservoir.  A sterile dressing was applied.  The patient  was extubated and returned recovery room.      Kerin Perna, M.D.  Electronically Signed     PV/MEDQ  D:  08/30/2006  T:  08/30/2006  Job:  [id number]

## 2010-12-22 LAB — URINE MICROSCOPIC-ADD ON

## 2010-12-22 LAB — CBC
HCT: 40.2
HCT: 41.6
HCT: 45.3
Hemoglobin: 14.1
Hemoglobin: 14.4
Hemoglobin: 15.5
MCHC: 34.2
MCHC: 35.2
MCV: 91.6
MCV: 91.9
Platelets: 199
RBC: 4.39
RDW: 13.2
RDW: 13.7
WBC: 15.5 — ABNORMAL HIGH
WBC: 9.7

## 2010-12-22 LAB — POCT I-STAT 3, VENOUS BLOOD GAS (G3P V)
Acid-Base Excess: 2
O2 Saturation: 92
TCO2: 30

## 2010-12-22 LAB — POCT I-STAT, CHEM 8
HCT: 49
Hemoglobin: 16.7
Potassium: 3.6
Sodium: 143
TCO2: 26

## 2010-12-22 LAB — URINALYSIS, ROUTINE W REFLEX MICROSCOPIC
Glucose, UA: NEGATIVE
Hgb urine dipstick: NEGATIVE
Leukocytes, UA: NEGATIVE
Protein, ur: 30 — AB
Specific Gravity, Urine: 1.019
Urobilinogen, UA: 1

## 2010-12-22 LAB — BASIC METABOLIC PANEL
Chloride: 99
GFR calc non Af Amer: >60
Glucose, Bld: 104 — ABNORMAL HIGH
Potassium: 3.6
Sodium: 134 — ABNORMAL LOW

## 2010-12-22 LAB — COMPREHENSIVE METABOLIC PANEL
AST: 99 — ABNORMAL HIGH
BUN: 5 — ABNORMAL LOW
CO2: 24
Chloride: 107
Creatinine, Ser: 0.81
GFR calc non Af Amer: >60
Glucose, Bld: 135 — ABNORMAL HIGH
Total Bilirubin: 0.7

## 2010-12-22 LAB — MAGNESIUM: Magnesium: 1.7

## 2010-12-22 LAB — RAPID URINE DRUG SCREEN, HOSP PERFORMED
Amphetamines: NOT DETECTED
Barbiturates: NOT DETECTED
Benzodiazepines: NOT DETECTED

## 2010-12-22 LAB — POCT I-STAT 3, ART BLOOD GAS (G3+)
Bicarbonate: 27.7 — ABNORMAL HIGH
O2 Saturation: 100
Patient temperature: 98.6
TCO2: 29
TCO2: 32
pCO2 arterial: 41.1
pCO2 arterial: 63.2
pH, Arterial: 7.281 — ABNORMAL LOW
pH, Arterial: 7.437

## 2010-12-22 LAB — APTT: aPTT: 28

## 2010-12-22 LAB — ACETAMINOPHEN LEVEL: Acetaminophen (Tylenol), Serum: <10 — ABNORMAL LOW

## 2010-12-22 LAB — ETHANOL: Alcohol, Ethyl (B): 549

## 2011-01-11 LAB — CBC
HCT: 32.7 — ABNORMAL LOW
Hemoglobin: 11.1 — ABNORMAL LOW
MCHC: 33.3
MCHC: 34.1
MCV: 79.1
RBC: 4.12 — ABNORMAL LOW
RBC: 4.21 — ABNORMAL LOW
RDW: 16.8 — ABNORMAL HIGH

## 2011-01-11 LAB — BASIC METABOLIC PANEL
CO2: 28
Calcium: 9.8
Chloride: 98
GFR calc Af Amer: >60
Potassium: 4.2
Sodium: 137

## 2011-01-12 LAB — I-STAT EC8
Acid-Base Excess: 1
BUN: <3 — ABNORMAL LOW
Bicarbonate: 25.4 — ABNORMAL HIGH
Chloride: 99
Glucose, Bld: 132 — ABNORMAL HIGH
HCT: 32 — ABNORMAL LOW
Hemoglobin: 10.9 — ABNORMAL LOW
Operator id: 209401
Potassium: 4.3
Sodium: 132 — ABNORMAL LOW
TCO2: 27
pCO2 arterial: 37.8
pH, Arterial: 7.435

## 2011-01-12 LAB — CBC
HCT: 28.8 — ABNORMAL LOW
HCT: 29.3 — ABNORMAL LOW
HCT: 29.3 — ABNORMAL LOW
HCT: 30.5 — ABNORMAL LOW
HCT: 31 — ABNORMAL LOW
HCT: 31.3 — ABNORMAL LOW
Hemoglobin: 10 — ABNORMAL LOW
Hemoglobin: 10 — ABNORMAL LOW
Hemoglobin: 10.1 — ABNORMAL LOW
Hemoglobin: 10.4 — ABNORMAL LOW
Hemoglobin: 10.4 — ABNORMAL LOW
Hemoglobin: 11.1 — ABNORMAL LOW
Hemoglobin: 11.1 — ABNORMAL LOW
Hemoglobin: 12.1 — ABNORMAL LOW
Hemoglobin: 9.9 — ABNORMAL LOW
MCHC: 33.9
MCHC: 34
MCHC: 34
MCHC: 34.3
MCHC: 34.5
MCV: 78
MCV: 78.5
MCV: 78.5
MCV: 79
MCV: 79.3
Platelets: 397
Platelets: 531 — ABNORMAL HIGH
Platelets: 575 — ABNORMAL HIGH
Platelets: 626 — ABNORMAL HIGH
Platelets: 651 — ABNORMAL HIGH
Platelets: 672 — ABNORMAL HIGH
Platelets: 675 — ABNORMAL HIGH
RBC: 3.85 — ABNORMAL LOW
RBC: 3.9 — ABNORMAL LOW
RBC: 3.96 — ABNORMAL LOW
RBC: 4.11 — ABNORMAL LOW
RBC: 4.53
RDW: 14.7 — ABNORMAL HIGH
RDW: 15.5 — ABNORMAL HIGH
RDW: 15.9 — ABNORMAL HIGH
RDW: 16 — ABNORMAL HIGH
RDW: 16.1 — ABNORMAL HIGH
RDW: 16.5 — ABNORMAL HIGH
RDW: 16.8 — ABNORMAL HIGH
WBC: 16.7 — ABNORMAL HIGH
WBC: 17.4 — ABNORMAL HIGH
WBC: 18.5 — ABNORMAL HIGH

## 2011-01-12 LAB — BASIC METABOLIC PANEL
BUN: 10
BUN: 4 — ABNORMAL LOW
BUN: 4 — ABNORMAL LOW
BUN: 6
BUN: 7
CO2: 27
CO2: 28
CO2: 30
Calcium: 8.6
Calcium: 9.2
Calcium: 9.5
Calcium: 9.8
Chloride: 102
Chloride: 103
Chloride: 99
Creatinine, Ser: 0.55
Creatinine, Ser: 0.63
GFR calc Af Amer: >60
GFR calc Af Amer: >60
GFR calc Af Amer: >60
GFR calc Af Amer: >60
GFR calc non Af Amer: >60
GFR calc non Af Amer: >60
GFR calc non Af Amer: >60
GFR calc non Af Amer: >60
Glucose, Bld: 153 — ABNORMAL HIGH
Glucose, Bld: 89
Glucose, Bld: 91
Glucose, Bld: 93
Glucose, Bld: 99
Potassium: 3.8
Potassium: 4.1
Potassium: 4.2
Potassium: 4.2
Sodium: 135
Sodium: 137
Sodium: 137
Sodium: 138
Sodium: 138

## 2011-01-12 LAB — POCT I-STAT CREATININE
Creatinine, Ser: 0.9
Operator id: 235561

## 2011-01-12 LAB — BLOOD GAS, ARTERIAL
Acid-Base Excess: 1
Acid-Base Excess: 2
Bicarbonate: 24.9 — ABNORMAL HIGH
Bicarbonate: 26.2 — ABNORMAL HIGH
O2 Content: 3
O2 Content: 6
O2 Saturation: 92.1
O2 Saturation: 95
Patient temperature: 102
Patient temperature: 98.1
TCO2: 26.6
TCO2: 27.1
pCO2 arterial: 41.5
pCO2 arterial: 42.7
pH, Arterial: 7.4
pH, Arterial: 7.41
pO2, Arterial: 63.2 — ABNORMAL LOW
pO2, Arterial: 83.2

## 2011-01-12 LAB — DIFFERENTIAL
Basophils Absolute: 0
Basophils Relative: 0
Eosinophils Absolute: 0.3
Eosinophils Relative: 0
Lymphocytes Relative: 7 — ABNORMAL LOW
Lymphs Abs: 1.2
Lymphs Abs: 2
Monocytes Absolute: 1.8 — ABNORMAL HIGH
Monocytes Relative: 11
Monocytes Relative: 9
Neutro Abs: 13.7 — ABNORMAL HIGH
Neutrophils Relative %: 78 — ABNORMAL HIGH
Neutrophils Relative %: 81 — ABNORMAL HIGH

## 2011-01-12 LAB — POCT I-STAT 3, ART BLOOD GAS (G3+)
Acid-Base Excess: 1
Acid-Base Excess: 2
Bicarbonate: 24.6 — ABNORMAL HIGH
Bicarbonate: 27.3 — ABNORMAL HIGH
O2 Saturation: 96
O2 Saturation: 96
Operator id: 123161
Operator id: 209401
Patient temperature: 100.7
Patient temperature: 97.7
TCO2: 26
TCO2: 29
pCO2 arterial: 37.4
pCO2 arterial: 41.5
pH, Arterial: 7.425
pH, Arterial: 7.431
pO2, Arterial: 81
pO2, Arterial: 86

## 2011-01-12 LAB — CROSSMATCH
ABO/RH(D): A POS
Antibody Screen: NEGATIVE

## 2011-01-12 LAB — I-STAT 8, (EC8 V) (CONVERTED LAB)
Acid-Base Excess: 7 — ABNORMAL HIGH
BUN: 28 — ABNORMAL HIGH
Bicarbonate: 30.1 — ABNORMAL HIGH
Chloride: 105
Glucose, Bld: 93
HCT: 32 — ABNORMAL LOW
Hemoglobin: 10.9 — ABNORMAL LOW
Operator id: 235561
Potassium: 3.1 — ABNORMAL LOW
Sodium: 123 — ABNORMAL LOW
TCO2: 31
pCO2, Ven: 36.3 — ABNORMAL LOW
pH, Ven: 7.526 — ABNORMAL HIGH

## 2011-01-12 LAB — COMPREHENSIVE METABOLIC PANEL
ALT: 13
ALT: 16
AST: 22
Albumin: 1.9 — ABNORMAL LOW
Alkaline Phosphatase: 81
BUN: 2 — ABNORMAL LOW
Calcium: 8.4
Calcium: 8.6
Chloride: 100
Creatinine, Ser: 0.63
Creatinine, Ser: 0.67
GFR calc Af Amer: >60
GFR calc Af Amer: >60
GFR calc non Af Amer: >60
Glucose, Bld: 100 — ABNORMAL HIGH
Glucose, Bld: 97
Potassium: 4.1
Sodium: 132 — ABNORMAL LOW
Sodium: 138
Total Bilirubin: 0.4
Total Protein: 5.3 — ABNORMAL LOW
Total Protein: 5.9 — ABNORMAL LOW
Total Protein: 6.7

## 2011-01-12 LAB — BODY FLUID CULTURE

## 2011-01-12 LAB — IRON AND TIBC
Saturation Ratios: 16 — ABNORMAL LOW
UIBC: 231

## 2011-01-12 LAB — WOUND CULTURE: Culture: NO GROWTH

## 2011-01-12 LAB — AFB CULTURE WITH SMEAR (NOT AT ARMC): Acid Fast Smear: NONE SEEN

## 2011-01-12 LAB — LIPID PANEL
HDL: 15 — ABNORMAL LOW
Triglycerides: 105

## 2011-01-12 LAB — FUNGUS CULTURE W SMEAR: Fungal Smear: NONE SEEN

## 2011-01-12 LAB — ANAEROBIC CULTURE

## 2011-01-12 LAB — FERRITIN: Ferritin: 429 — ABNORMAL HIGH (ref 22–322)

## 2011-01-12 LAB — OSMOLALITY, URINE: Osmolality, Ur: 535

## 2011-01-12 LAB — VANCOMYCIN, TROUGH: Vancomycin Tr: 20.3 — ABNORMAL HIGH

## 2011-01-12 LAB — CULTURE, BLOOD (ROUTINE X 2): Culture: NO GROWTH

## 2011-01-12 LAB — ABO/RH: ABO/RH(D): A POS

## 2011-01-12 LAB — VITAMIN B12: Vitamin B-12: 633 (ref 211–911)

## 2011-01-12 LAB — TSH: TSH: 1.855

## 2011-01-12 LAB — RETICULOCYTES: Retic Ct Pct: 2.6

## 2011-01-12 LAB — PROTIME-INR
INR: 1.4
Prothrombin Time: 17.3 — ABNORMAL HIGH

## 2011-01-12 LAB — APTT: aPTT: 45 — ABNORMAL HIGH

## 2011-07-04 ENCOUNTER — Emergency Department (HOSPITAL_COMMUNITY)
Admission: EM | Admit: 2011-07-04 | Discharge: 2011-07-04 | Disposition: A | Discharge status: Home / Self Care | Payer: Self-pay | Attending: Emergency Medicine | Admitting: Emergency Medicine

## 2011-07-04 ENCOUNTER — Encounter (HOSPITAL_COMMUNITY): Payer: Self-pay | Admitting: *Deleted

## 2011-07-04 DIAGNOSIS — S81809A Unspecified open wound, unspecified lower leg, initial encounter: Secondary | ICD-10-CM | POA: Insufficient documentation

## 2011-07-04 DIAGNOSIS — S81009A Unspecified open wound, unspecified knee, initial encounter: Secondary | ICD-10-CM | POA: Insufficient documentation

## 2011-07-04 DIAGNOSIS — Y998 Other external cause status: Secondary | ICD-10-CM | POA: Insufficient documentation

## 2011-07-04 DIAGNOSIS — Z23 Encounter for immunization: Secondary | ICD-10-CM | POA: Insufficient documentation

## 2011-07-04 DIAGNOSIS — W540XXA Bitten by dog, initial encounter: Secondary | ICD-10-CM | POA: Insufficient documentation

## 2011-07-04 MED ORDER — TETANUS-DIPHTH-ACELL PERTUSSIS 5-2.5-18.5 LF-MCG/0.5 IM SUSP
0.5000 mL | Freq: Once | INTRAMUSCULAR | Status: AC
  Administered 2011-07-04: 0.5 mL via INTRAMUSCULAR
  Filled 2011-07-04: qty 0.5

## 2011-07-04 NOTE — Discharge Instructions (Signed)
Animal Bite An animal bite can result in a scratch on the skin, deep open cut, puncture of the skin, crush injury, or tearing away of the skin or a body part. Dogs are responsible for most animal bites. Children are bitten more often than adults. An animal bite can range from very mild to more serious. A small bite from your house pet is no cause for alarm. However, some animal bites can become infected or injure a bone or other tissue. You must seek medical care if:  The skin is broken and bleeding does not slow down or stop after 15 minutes.   The puncture is deep and difficult to clean (such as a cat bite).   Pain, warmth, redness, or pus develops around the wound.   The bite is from a stray animal or rodent. There may be a risk of rabies infection.   The bite is from a snake, raccoon, skunk, fox, coyote, or bat. There may be a risk of rabies infection.   The person bitten has a chronic illness such as diabetes, liver disease, or cancer, or the person takes medicine that lowers the immune system.   There is concern about the location and severity of the bite.  It is important to clean and protect an animal bite wound right away to prevent infection. Follow these steps:  Clean the wound with plenty of water and soap.   Apply an antibiotic cream.   Apply gentle pressure over the wound with a clean towel or gauze to slow or stop bleeding.   Elevate the affected area above the heart to help stop any bleeding.   Seek medical care. Getting medical care within 8 hours of the animal bite leads to the best possible outcome.  DIAGNOSIS  Your caregiver will most likely:  Take a detailed history of the animal and the bite injury.   Perform a wound exam.   Take your medical history.  Blood tests or X-rays may be performed. Sometimes, infected bite wounds are cultured and sent to a lab to identify the infectious bacteria.  TREATMENT  Medical treatment will depend on the location and type of  animal bite as well as the patient's medical history. Treatment may include:  Wound care, such as cleaning and flushing the wound with saline solution, bandaging, and elevating the affected area.   Antibiotics.   Tetanus immunization.   Rabies immunization.   Leaving the wound open to heal. This is often done with animal bites, due to the high risk of infection. However, in certain cases, wound closure with stitches, wound adhesive, skin adhesive strips, or staples may be used.  Infected bites that are left untreated may require intravenous (IV) antibiotics and surgical treatment in the hospital. HOME CARE INSTRUCTIONS  Follow your caregiver's instructions for wound care.   Take all medicines as directed.   If your caregiver prescribes antibiotics, take them as directed. Finish them even if you start to feel better.   Follow up with your caregiver for further exams or immunizations as directed.  You may need a tetanus shot if:  You cannot remember when you had your last tetanus shot.   You have never had a tetanus shot.   The injury broke your skin.  If you get a tetanus shot, your arm may swell, get red, and feel warm to the touch. This is common and not a problem. If you need a tetanus shot and you choose not to have one, there is a   rare chance of getting tetanus. Sickness from tetanus can be serious. SEEK MEDICAL CARE IF:  You notice warmth, redness, soreness, swelling, pus discharge, or a bad smell coming from the wound.   You have a red line on the skin coming from the wound.   You have a fever, chills, or a general ill feeling.   You have nausea or vomiting.   You have continued or worsening pain.   You have trouble moving the injured part.   You have other questions or concerns.  MAKE SURE YOU:  Understand these instructions.   Will watch your condition.   Will get help right away if you are not doing well or get worse.  Document Released: 11/29/2010  Document Revised: 03/02/2011 Document Reviewed: 11/29/2010 [facility] Patient Information 2012 [place], [place].Laceration Care, Adult A laceration is a cut that goes through all layers of the skin. The cut goes into the tissue beneath the skin. HOME CARE For stitches (sutures) or staples:  Keep the cut clean and dry.   If you have a bandage (dressing), change it at least once a day. Change the bandage if it gets wet or dirty, or as told by your doctor.   Wash the cut with soap and water 2 times a day. Rinse the cut with water. Pat it dry with a clean towel.   Put a thin layer of medicated cream on the cut as told by your doctor.   You may shower after the first 24 hours. Do not soak the cut in water until the stitches are removed.   Only take medicines as told by your doctor.   Have your stitches or staples removed as told by your doctor.  For skin adhesive strips:  Keep the cut clean and dry.   Do not get the strips wet. You may take a bath, but be careful to keep the cut dry.   If the cut gets wet, pat it dry with a clean towel.   The strips will fall off on their own. Do not remove the strips that are still stuck to the cut.  For wound glue:  You may shower or take baths. Do not soak or scrub the cut. Do not swim. Avoid heavy sweating until the glue falls off on its own. After a shower or bath, pat the cut dry with a clean towel.   Do not put medicine on your cut until the glue falls off.   If you have a bandage, do not put tape over the glue.   Avoid lots of sunlight or tanning lamps until the glue falls off. Put sunscreen on the cut for the first year to reduce your scar.   The glue will fall off on its own. Do not pick at the glue.  You may need a tetanus shot if:  You cannot remember when you had your last tetanus shot.   You have never had a tetanus shot.  If you need a tetanus shot and you choose not to have one, you may get tetanus. Sickness from tetanus can be  serious. GET HELP RIGHT AWAY IF:   Your pain does not get better with medicine.   Your arm, hand, leg, or foot loses feeling (numbness) or changes color.   Your cut is bleeding.   Your joint feels weak, or you cannot use your joint.   You have painful lumps on your body.   Your cut is red, puffy (swollen), or painful.   You have  a red line on the skin near the cut.   You have yellowish-white fluid (pus) coming from the cut.   You have a fever.   You have a bad smell coming from the cut or bandage.   Your cut breaks open before or after stitches are removed.   You notice something coming out of the cut, such as wood or glass.   You cannot move a finger or toe.  MAKE SURE YOU:   Understand these instructions.   Will watch your condition.   Will get help right away if you are not doing well or get worse.  Document Released: 08/30/2007 Document Revised: 03/02/2011 Document Reviewed: 09/06/2010 [facility] Patient Information 2012 [place], [place].  RESOURCE GUIDE  Dental Problems  Patients with Medicaid: [facility] [phone]                                           [phone] [address] [employer] [address] Phone:  [phone]                                                  Phone:  [phone]  If unable to pay or uninsured, contact:  [facility] or [facility] to become qualified for the adult dental clinic.  Chronic Pain Problems Contact [facility]  [phone] Patients need to be referred by their primary care doctor.  Insufficient Money for Medicine Contact [employer]:  call "211" or [facility] (770) 855-4725.  No Primary Care Doctor Call [facility]  619-772-4136 Other agencies that provide inexpensive medical care    [facility]  8671538345    [facility]  [phone]    [facility]  7028389006    [facility]  [phone]    Planned Parenthood   [phone]    [facility]  [phone]  Psychological Services [facility] Behavioral Health  [phone] [facility] Services  [phone] [facility]   ([phone] (emergency services 206-637-4338)  Substance Abuse Resources [facility]  4181083433 [facility] ([phone] 786-2220 [facility] [place] 364-445-2629 Floydene Flock [phone] Residential & Outpatient Substance Abuse Program  201-589-6411  Abuse/Neglect [facility] Child Abuse Hotline 712-413-7485 [facility] Child Abuse Hotline (604)544-3117 (After Hours)  Emergency Shelter [facility] 669-574-6107  Maternity Homes [facility] at the [place] [facility] (423)022-4197 [facility] (239)637-4600  MRSA Hotline #:   734 623 6599    [facility] Resources  [facility]     [employer]                          [facility] Dept. [address] [place]                       [address]      [address]  [address]  Sela Hua Phone:  [phone]                                   Phone:  [phone]                 Phone:  [place] Phone:  [place] [phone] (After Hours)

## 2011-07-04 NOTE — ED Notes (Signed)
Patient reports he was bit on his left lower leg on Friday.  The owner reported that the dog has had his shots.  Patient is here for tetenus.  He has not filed a report regarding the dog bite

## 2011-07-14 NOTE — ED Provider Notes (Signed)
History    46yM presenting for tetanus update. Bitten by unknown dog while walking 3d ago. L shin. Pain improving. No fever or chills. No numbness or tingling. NO drainage. NO n/v. Ambulatory without difficulty. NO numbness, tingling or loss of strength.  CSN: [id number]  Arrival date & time 07/04/11  1521   First MD Initiated Contact with Patient 07/04/11 1725      Chief Complaint  Patient presents with  . Animal Bite    (Consider location/radiation/quality/duration/timing/severity/associated sxs/prior treatment) HPI  History reviewed. No pertinent past medical history.  Past Surgical History  Procedure Date  . Lung surgery     No family history on file.  History  Substance Use Topics  . Smoking status: Current Everyday Smoker  . Smokeless tobacco: Not on file  . Alcohol Use: Yes      Review of Systems  Review of symptoms negative unless otherwise noted in HPI.  Allergies  Review of patient's allergies indicates no known allergies.  Home Medications  No current outpatient prescriptions on file.  BP 129/91  Pulse 107  Temp(Src) 98.1 F (36.7 C) (Oral)  Resp 18  Ht 5\' 9"  (1.753 m)  Wt 170 lb (77.111 kg)  BMI 25.10 kg/m2  SpO2 97%  Physical Exam  Nursing note and vitals reviewed. Constitutional: He appears well-developed and well-nourished. No distress.  HENT:  Head: Normocephalic and atraumatic.  Eyes: Conjunctivae are normal. Right eye exhibits no discharge. Left eye exhibits no discharge.  Neck: Neck supple.  Cardiovascular: Normal rate, regular rhythm and normal heart sounds.  Exam reveals no gallop and no friction rub.   No murmur heard. Pulmonary/Chest: Effort normal and breath sounds normal. No respiratory distress.  Abdominal: Soft. He exhibits no distension. There is no tenderness.  Musculoskeletal: He exhibits no edema and no tenderness.  Neurological: He is alert.  Skin: Skin is warm and dry.       What appears to be superficial scabbed  abrasions to L shin. NO drainage. Nontender. NO surrounding cellulitic changes.  Psychiatric: He has a normal mood and affect. His behavior is normal. Thought content normal.    ED Course  Procedures (including critical care time)  Labs Reviewed - No data to display No results found.   1. Dog bite       MDM  46yM with dog bite. Happened 3d ago and no evidence of infection. Healing well. Return precautions discussed. Tetanus updated.        Raeford Razor, MD 07/14/11 [phone]

## 2012-08-21 ENCOUNTER — Encounter (HOSPITAL_COMMUNITY): Payer: Self-pay | Admitting: *Deleted

## 2012-08-21 ENCOUNTER — Emergency Department (INDEPENDENT_AMBULATORY_CARE_PROVIDER_SITE_OTHER)
Admission: EM | Admit: 2012-08-21 | Discharge: 2012-08-21 | Disposition: A | Discharge status: Home / Self Care | Payer: Self-pay | Source: Home / Self Care

## 2012-08-21 DIAGNOSIS — R21 Rash and other nonspecific skin eruption: Secondary | ICD-10-CM

## 2012-08-21 DIAGNOSIS — T63391A Toxic effect of venom of other spider, accidental (unintentional), initial encounter: Secondary | ICD-10-CM

## 2012-08-21 DIAGNOSIS — T6391XA Toxic effect of contact with unspecified venomous animal, accidental (unintentional), initial encounter: Secondary | ICD-10-CM

## 2012-08-21 HISTORY — DX: Pneumonia, unspecified organism: J18.9

## 2012-08-21 HISTORY — DX: Essential (primary) hypertension: I10

## 2012-08-21 MED ORDER — CETIRIZINE HCL 10 MG PO TABS: 10.0000 mg | ORAL_TABLET | Freq: Every day | ORAL | Status: DC

## 2012-08-21 NOTE — ED Notes (Addendum)
C/o spider bite to R forearm ventral surface onset Sunday.  States he was working outside, but did not see a Armed forces logistics/support/administrative officer.  He was putting a patio down and mulch.  It started itching.  Monday night he saw the blisters.  He started using bleach and soapy water on it.

## 2012-08-21 NOTE — ED Provider Notes (Signed)
History     CSN: [id number]  Arrival date & time 08/21/12  1811   None     Chief Complaint  Patient presents with  . Insect Bite    (Consider location/radiation/quality/duration/timing/severity/associated sxs/prior treatment) HPI Comments: Pt presents for eval of possible spider bite.  He was working in the yard Sunday when he noticed his left forearm was incredibly itchy.  Since then, he has started to have some vesicles pop up that are incredibly itchy.  Most are in a little group but there are a few more spread out from that.  He had a brown recluse bite once before that started very similarly to this.  Denies pain, fever.     Past Medical History  Diagnosis Date  . Hypertension   . Pneumonia     Past Surgical History  Procedure Laterality Date  . Lung surgery  2011    put a tube in to drain fluid out of lungs from pneumonia    Family History  Problem Relation Age of Onset  . Stroke Mother   . Lung cancer Father     History  Substance Use Topics  . Smoking status: Current Every Day Smoker -- 0.25 packs/day    Types: Cigarettes  . Smokeless tobacco: Not on file  . Alcohol Use: 12.6 oz/week    21 Cans of beer per week     Comment: 3 beers/day      Review of Systems  Constitutional: Negative for fever, chills and fatigue.  HENT: Negative for sore throat, neck pain and neck stiffness.   Eyes: Negative for visual disturbance.  Respiratory: Negative for cough and shortness of breath.   Cardiovascular: Negative for chest pain, palpitations and leg swelling.  Gastrointestinal: Negative for nausea, vomiting, abdominal pain, diarrhea and constipation.  Genitourinary: Negative for dysuria, urgency, frequency and hematuria.  Musculoskeletal: Negative for myalgias and arthralgias.  Skin: Positive for rash (see HPI ).  Neurological: Negative for dizziness, weakness and light-headedness.    Allergies  Review of patient's allergies indicates no known  allergies.  Home Medications   Current Outpatient Rx  Name  Route  Sig  Dispense  Refill  . diphenhydrAMINE (BENADRYL) 25 MG tablet   Oral   Take 25 mg by mouth every 6 (six) hours as needed for allergies.         . cetirizine (ZYRTEC) 10 MG tablet   Oral   Take 1 tablet (10 mg total) by mouth daily.   30 tablet   1     BP 133/91  Pulse 90  Temp(Src) 98 F (36.7 C) (Oral)  Resp 18  SpO2 97%  Physical Exam  Constitutional: He is oriented to person, place, and time. He appears well-developed and well-nourished. No distress.  HENT:  Head: Normocephalic and atraumatic.  Eyes: EOM are normal. Pupils are equal, round, and reactive to light.  Cardiovascular: Normal rate and regular rhythm.  Exam reveals no gallop and no friction rub.   No murmur heard. Pulmonary/Chest: Effort normal and breath sounds normal. No respiratory distress. He has no wheezes. He has no rales.  Abdominal: Soft. There is no tenderness.  Neurological: He is oriented to person, place, and time.  Skin: Skin is warm and dry. Rash noted. Rash is vesicular (on right forearm.  One larger vesicle with a few other smaller discrete vesicles without any erythema).  Psychiatric: He has a normal mood and affect. Judgment normal.    ED Course  Procedures (including critical care time)  Labs Reviewed - No data to display No results found.   1. Brown recluse spider bite or sting, initial encounter   2. Rash       MDM   With history and appearance of the rash, this does appear to be an early presentation of a brown recluse bite.  Will numb up the area and scrub the primary lesion, and also Rx antihistamine   Pt may f/u with general surgery if needed, he will wait to see if it gets better or worse from here.          Graylon Good, PA-C 08/21/12 2040

## 2012-08-24 ENCOUNTER — Emergency Department (HOSPITAL_COMMUNITY): Payer: Self-pay

## 2012-08-24 ENCOUNTER — Encounter (HOSPITAL_COMMUNITY): Payer: Self-pay | Admitting: *Deleted

## 2012-08-24 ENCOUNTER — Emergency Department (HOSPITAL_COMMUNITY)
Admission: EM | Admit: 2012-08-24 | Discharge: 2012-08-24 | Disposition: A | Discharge status: Home / Self Care | Payer: Self-pay | Attending: Emergency Medicine | Admitting: Emergency Medicine

## 2012-08-24 DIAGNOSIS — F17200 Nicotine dependence, unspecified, uncomplicated: Secondary | ICD-10-CM | POA: Insufficient documentation

## 2012-08-24 DIAGNOSIS — L299 Pruritus, unspecified: Secondary | ICD-10-CM | POA: Insufficient documentation

## 2012-08-24 DIAGNOSIS — Z8701 Personal history of pneumonia (recurrent): Secondary | ICD-10-CM | POA: Insufficient documentation

## 2012-08-24 DIAGNOSIS — R238 Other skin changes: Secondary | ICD-10-CM

## 2012-08-24 DIAGNOSIS — M79609 Pain in unspecified limb: Secondary | ICD-10-CM | POA: Insufficient documentation

## 2012-08-24 DIAGNOSIS — Z79899 Other long term (current) drug therapy: Secondary | ICD-10-CM | POA: Insufficient documentation

## 2012-08-24 DIAGNOSIS — R21 Rash and other nonspecific skin eruption: Secondary | ICD-10-CM | POA: Insufficient documentation

## 2012-08-24 DIAGNOSIS — I10 Essential (primary) hypertension: Secondary | ICD-10-CM | POA: Insufficient documentation

## 2012-08-24 DIAGNOSIS — L988 Other specified disorders of the skin and subcutaneous tissue: Secondary | ICD-10-CM | POA: Insufficient documentation

## 2012-08-24 MED ORDER — PREDNISONE 20 MG PO TABS: ORAL_TABLET | ORAL | Status: DC

## 2012-08-24 MED ORDER — PREDNISONE 20 MG PO TABS
60.0000 mg | ORAL_TABLET | Freq: Once | ORAL | Status: AC
  Administered 2012-08-24: 60 mg via ORAL
  Filled 2012-08-24: qty 3

## 2012-08-24 NOTE — ED Notes (Signed)
Pt was seen on 5/28 at urgent care and diagnosed with brown recluse spider bite to right forearm and patient states it looks worse to him and it is starting to spread

## 2012-08-24 NOTE — ED Provider Notes (Signed)
Medical screening examination/treatment/procedure(s) were performed by resident physician or non-physician practitioner and as supervising physician I was immediately available for consultation/collaboration.   Kiylah Loyer DOUGLAS MD.   Mizraim Harmening D Lafaye Mcelmurry, MD 08/24/12 1100 

## 2012-08-24 NOTE — ED Notes (Signed)
Pt stated that he was bit by a spider on Aug 19, 2012. He has several bumps. One bump with minimal bleeding. CNS intact. No swelling noted. Will continue to monitor.

## 2012-08-24 NOTE — ED Provider Notes (Signed)
History     CSN: [id number]  Arrival date & time 08/24/12  1451   First MD Initiated Contact with Patient 08/24/12 1614      Chief Complaint  Patient presents with  . Spider bite     (Consider location/radiation/quality/duration/timing/severity/associated sxs/prior treatment) HPI Comments: Patient with h/o alcoholism -- presents with complaint of right forearm pain and itching that began 3 days ago after he was presumably bitten by a spider. Patient was concerned that this was a brown recluse spider. Patient was treated with antihistamines and given surgical followup as needed. Patient returns today for recheck. He states that the area has spread and he has gotten additional bumps. Mostly these bumps are itchy but sometimes they are painful. They do not drain. Patient denies expanding redness or warmth, streaking up his arm. He denies fever. No nausea or vomiting. No other treatments prior to arrival. No weakness, numbness, or tingling of his upper extremity. Onset of symptoms gradual. Course is gradually worsening. Nothing makes symptoms better or worse.  The history is provided by the patient.    Past Medical History  Diagnosis Date  . Hypertension   . Pneumonia     Past Surgical History  Procedure Laterality Date  . Lung surgery  2011    put a tube in to drain fluid out of lungs from pneumonia    Family History  Problem Relation Age of Onset  . Stroke Mother   . Lung cancer Father     History  Substance Use Topics  . Smoking status: Current Every Day Smoker -- 0.25 packs/day    Types: Cigarettes  . Smokeless tobacco: Not on file  . Alcohol Use: 12.6 oz/week    21 Cans of beer per week     Comment: 3 beers/day      Review of Systems  Constitutional: Negative for fever.  HENT: Negative for sore throat and rhinorrhea.   Eyes: Negative for redness.  Respiratory: Negative for cough.   Cardiovascular: Negative for chest pain.  Gastrointestinal: Negative for  nausea, vomiting, abdominal pain and diarrhea.  Genitourinary: Negative for dysuria.  Musculoskeletal: Negative for myalgias.  Skin: Positive for rash.  Neurological: Negative for numbness and headaches.    Allergies  [facility]  Home Medications   Current Outpatient Rx  Name  Route  Sig  Dispense  Refill  . cetirizine (ZYRTEC) 10 MG tablet   Oral   Take 1 tablet (10 mg total) by mouth daily.   30 tablet   1   . diphenhydrAMINE (BENADRYL) 25 MG tablet   Oral   Take 25 mg by mouth every 6 (six) hours as needed for allergies.         . diphenhydrAMINE-zinc acetate (BENADRYL) cream   Topical   Apply 1 application topically daily as needed for itching (Apply to spider bite for itching).         . neomycin-bacitracin-polymyxin (NEOSPORIN) ointment   Topical   Apply 1 application topically once. Apply to spider bite           BP 133/82  Pulse 127  Temp(Src) 98.7 F (37.1 C) (Oral)  Resp 18  SpO2 98%  Physical Exam  Nursing note and vitals reviewed. Constitutional: He appears well-developed and well-nourished.  HENT:  Head: Normocephalic and atraumatic.  Eyes: Conjunctivae are normal. Right eye exhibits no discharge. Left eye exhibits no discharge.  Neck: Normal range of motion. Neck supple.  Cardiovascular: Normal rate, regular rhythm and normal heart sounds.  Pulmonary/Chest: Effort normal and breath sounds normal.  Abdominal: Soft. There is no tenderness.  Neurological: He is alert.  Skin: Skin is warm and dry.  Patient with central area of erythema, no necrosis or eschar with scattered surrounding vesicles. No cellulitis or lymphangitis.   Psychiatric: He has a normal mood and affect.                 ED Course  Procedures (including critical care time)  Labs Reviewed - No data to display Dg Forearm Right  08/24/2012   *RADIOLOGY REPORT*  Clinical Data: Skin infection.  Spider bite.  RIGHT FOREARM - 2 VIEW  Comparison: None.  Findings: Soft tissue  swelling within the forearm soft tissues.  No underlying bony abnormality.  No fracture, subluxation or dislocation.  No radiopaque foreign body.  IMPRESSION: No acute bony abnormality.   Original Report Authenticated By: Charlett Nose, M.D.     1. Vesicular eruption of skin     5:05 PM Patient seen and examined. Will get x-ray to assure there is no soft tissue gas, begin patient on prednisone for allergic reaction.    Vital signs reviewed and are as follows: Filed Vitals:   08/24/12 1505  BP: 133/82  Pulse: 127  Temp: 98.7 F (37.1 C)  Resp: 18  6:19 PM x-ray reviewed by myself. Patient informed.  Will discharge to home with prednisone.  BP 122/81  Pulse 108  Temp(Src) 98 F (36.7 C) (Oral)  Resp 16  SpO2 100%  Urged patient to return to urgent care in 2 days if not improving. Pt urged to return with worsening pain, worsening swelling, expanding area of redness or streaking up extremity, fever, or any other concerns. Pt verbalizes understanding and agrees with plan.  Counseled patient on wound care and to avoid using proximate or alcohol clean the area.  MDM  Patient with probable allergic reaction to right forearm. X-ray does not demonstrate any underlying gas in the soft tissues of the forearm. Patient has mostly itching. Question of brown recluse bite, although it is not classic in appearance for this. I do not see any necrosis or eschar at this time. Area is only minimally tender. Do not suspect secondary infection at this point. Close followup indicated.        Renne Crigler, PA-C 08/24/12 Rickey Primus

## 2012-10-29 ENCOUNTER — Encounter (HOSPITAL_COMMUNITY): Payer: Self-pay

## 2012-10-29 ENCOUNTER — Emergency Department (HOSPITAL_COMMUNITY)
Admission: EM | Admit: 2012-10-29 | Discharge: 2012-10-30 | Disposition: A | Discharge status: Other Acute Inpatient Hospital | Payer: No Typology Code available for payment source | Attending: Emergency Medicine | Admitting: Emergency Medicine

## 2012-10-29 DIAGNOSIS — I10 Essential (primary) hypertension: Secondary | ICD-10-CM | POA: Insufficient documentation

## 2012-10-29 DIAGNOSIS — F1010 Alcohol abuse, uncomplicated: Secondary | ICD-10-CM

## 2012-10-29 DIAGNOSIS — F10229 Alcohol dependence with intoxication, unspecified: Secondary | ICD-10-CM | POA: Insufficient documentation

## 2012-10-29 DIAGNOSIS — F329 Major depressive disorder, single episode, unspecified: Secondary | ICD-10-CM

## 2012-10-29 DIAGNOSIS — Z79899 Other long term (current) drug therapy: Secondary | ICD-10-CM | POA: Insufficient documentation

## 2012-10-29 DIAGNOSIS — F17200 Nicotine dependence, unspecified, uncomplicated: Secondary | ICD-10-CM | POA: Insufficient documentation

## 2012-10-29 DIAGNOSIS — Z8701 Personal history of pneumonia (recurrent): Secondary | ICD-10-CM | POA: Insufficient documentation

## 2012-10-29 HISTORY — DX: Alcohol abuse, in remission: F10.11

## 2012-10-29 HISTORY — DX: Alcohol dependence, uncomplicated: F10.20

## 2012-10-29 LAB — CBC
HCT: 44.4 % (ref 39.0–52.0)
Hemoglobin: 15.9 g/dL (ref 13.0–17.0)
MCV: 87.1 fL (ref 78.0–100.0)
Platelets: 198 10*3/uL (ref 150–400)
RBC: 5.1 MIL/uL (ref 4.22–5.81)
WBC: 9.2 10*3/uL (ref 4.0–10.5)

## 2012-10-29 LAB — COMPREHENSIVE METABOLIC PANEL
AST: 29 U/L (ref 0–37)
BUN: 5 mg/dL — ABNORMAL LOW (ref 6–23)
CO2: 24 mEq/L (ref 19–32)
Chloride: 97 mEq/L (ref 96–112)
Creatinine, Ser: 0.77 mg/dL (ref 0.50–1.35)
GFR calc non Af Amer: >90 mL/min (ref >90)
Total Bilirubin: 0.4 mg/dL (ref 0.3–1.2)

## 2012-10-29 LAB — RAPID URINE DRUG SCREEN, HOSP PERFORMED: Amphetamines: NOT DETECTED

## 2012-10-29 LAB — SALICYLATE LEVEL: Salicylate Lvl: <2 mg/dL — ABNORMAL LOW (ref 2.8–20.0)

## 2012-10-29 MED ORDER — THIAMINE HCL 100 MG/ML IJ SOLN: 100.0000 mg | Freq: Every day | INTRAMUSCULAR | Status: DC

## 2012-10-29 MED ORDER — LORAZEPAM 1 MG PO TABS
0.0000 mg | ORAL_TABLET | Freq: Four times a day (QID) | ORAL | Status: DC
Start: 2012-10-29 — End: 2012-10-30
  Administered 2012-10-30: 1 mg via ORAL
  Filled 2012-10-29: qty 1

## 2012-10-29 MED ORDER — LORAZEPAM 1 MG PO TABS: 0.0000 mg | ORAL_TABLET | Freq: Two times a day (BID) | ORAL | Status: DC

## 2012-10-29 MED ORDER — VITAMIN B-1 100 MG PO TABS
100.0000 mg | ORAL_TABLET | Freq: Every day | ORAL | Status: DC
  Administered 2012-10-29: 100 mg via ORAL
  Filled 2012-10-29: qty 1

## 2012-10-29 NOTE — ED Provider Notes (Signed)
Medical screening examination/treatment/procedure(s) were performed by non-physician practitioner and as supervising physician I was immediately available for consultation/collaboration.   Audree Camel, MD 10/29/12 [phone]

## 2012-10-29 NOTE — ED Notes (Signed)
Detox from ETOH. Last drink 3am. Denies SI/Hi

## 2012-10-29 NOTE — ED Provider Notes (Signed)
CSN: [id number]     Arrival date & time 10/29/12  1122 History     First MD Initiated Contact with Patient 10/29/12 1148     Chief Complaint  Patient presents with  . Medical Clearance    ETOH   (Consider location/radiation/quality/duration/timing/severity/associated sxs/prior Treatment) HPI  46 year old male with history of alcohol abuse presents requesting alcohol detox.  Patient admits that he has a history of alcohol abuse, last drink was 3 AM this morning. He is here requesting for detox because he would like to "change my life around". Last detox attempt was 9 years ago. Sts he is working with [facility] and would like to be employed one day once he is sober from alcohol.  Denies SI/HI/or hallucination.  No other complaint.  Is a smoker, denies hx of rec drug use except occasional marijuana.  Past Medical History  Diagnosis Date  . Hypertension   . Pneumonia   . EtOH dependence   . H/O ETOH abuse    Past Surgical History  Procedure Laterality Date  . Lung surgery  2011    put a tube in to drain fluid out of lungs from pneumonia   Family History  Problem Relation Age of Onset  . Stroke Mother   . Lung cancer Father    History  Substance Use Topics  . Smoking status: Current Every Day Smoker -- 0.25 packs/day    Types: Cigarettes  . Smokeless tobacco: Not on file  . Alcohol Use: 12.6 oz/week    21 Cans of beer per week     Comment: 3 beers/day    Review of Systems  Constitutional: Negative for fever.  Respiratory: Negative for shortness of breath.   Cardiovascular: Negative for chest pain.  Gastrointestinal: Negative for nausea, vomiting and abdominal pain.  Skin: Negative for rash and wound.  Neurological: Negative for headaches.  All other systems reviewed and are negative.    Allergies  P[facility]  Home Medications   Current Outpatient Rx  Name  Route  Sig  Dispense  Refill  . cetirizine (ZYRTEC) 10 MG tablet   Oral   Take 1 tablet (10 mg total) by mouth  daily.   30 tablet   1   . diphenhydrAMINE (BENADRYL) 25 MG tablet   Oral   Take 25 mg by mouth every 6 (six) hours as needed for allergies.         . diphenhydrAMINE-zinc acetate (BENADRYL) cream   Topical   Apply 1 application topically daily as needed for itching (Apply to spider bite for itching).         . neomycin-bacitracin-polymyxin (NEOSPORIN) ointment   Topical   Apply 1 application topically once. Apply to spider bite         . predniSONE (DELTASONE) 20 MG tablet      3 Tabs PO Days 1-3, then 2 tabs PO Days 4-6, then 1 tab PO Day 7-9, then Half Tab PO Day 10-12   20 tablet   0    BP 140/99  Pulse 114  Temp(Src) 98.1 F (36.7 C) (Oral)  Resp 17  Ht 5\' 10"  (1.778 m)  Wt 170 lb (77.111 kg)  BMI 24.39 kg/m2  SpO2 98% Physical Exam  Nursing note and vitals reviewed. Constitutional: He appears well-developed and well-nourished. No distress.  Awake, alert, nontoxic appearance  HENT:  Head: Atraumatic.  Eyes: Conjunctivae are normal. Right eye exhibits no discharge. Left eye exhibits no discharge.  Neck: Normal range of motion. Neck supple.  Cardiovascular: Normal rate and regular rhythm.   Pulmonary/Chest: Effort normal. No respiratory distress. He exhibits no tenderness.  Abdominal: Soft. There is no tenderness. There is no rebound.  Musculoskeletal: He exhibits no tenderness.  ROM appears intact, no obvious focal weakness  Neurological: He is alert. He has normal strength. Coordination and gait normal. GCS eye subscore is 4. GCS verbal subscore is 5. GCS motor subscore is 6.  Skin: Skin is warm and dry. No rash noted.  Psychiatric: He has a normal mood and affect.    ED Course   Procedures (including critical care time)  12:16 PM Patient with history of alcohol abuse presents requesting for alcohol detox. He is currently clinically sober.  Patient is medically cleared. Psych hold, CIWA and med rec ordered.    12:23 PM I have consulted with Selena Batten,  from [facility] who agrees to see and evaluate pt.  Will move to [facility] for further care.    Labs Reviewed  COMPREHENSIVE METABOLIC PANEL - Abnormal; Notable for the following:    Sodium 134 (*)    Glucose, Bld 103 (*)    BUN 5 (*)    All other components within normal limits  ETHANOL - Abnormal; Notable for the following:    Alcohol, Ethyl (B) 167 (*)    All other components within normal limits  SALICYLATE LEVEL - Abnormal; Notable for the following:    Salicylate Lvl <2.0 (*)    All other components within normal limits  ACETAMINOPHEN LEVEL  CBC  URINE RAPID DRUG SCREEN (HOSP PERFORMED)   No results found. 1. Alcohol abuse     MDM  BP 140/99  Pulse 114  Temp(Src) 98.1 F (36.7 C) (Oral)  Resp 17  Ht 5\' 10"  (1.778 m)  Wt 170 lb (77.111 kg)  BMI 24.39 kg/m2  SpO2 98%   Fayrene Helper, PA-C 10/29/12 1246

## 2012-10-29 NOTE — Progress Notes (Signed)
[facility] provided patient with a [employer] of the [place].

## 2012-10-29 NOTE — BH Assessment (Signed)
Assessment Note  Jonathon Hunt is an 47 y.o. male with history of alcohol abuse presenting to [facility] for medical clearance. Pt requesting alcohol detox today. His last drink was 3 am this morning. Patient reports heavy alcohol use for the last 15-20 years. Patient drinking a 6 pack of beer daily and a fifth of liquor. Patient does not have a history of seizures, black outs, or DT's. His only withdrawal symptoms at this time is "slight tremors". Patient completed a detox program over 9 yrs ago. His longest period of sobriety is "a few days".  He is motivated to stop drinking so that he may obtain employment. Pt also verbalizes that he would like to participate in a residential program such as [facility] following the completion of his detox. Pt denies recreational drug use. His UDS is negative for drugs. Per ED, notes patient did report occasional THC use.   Patient denies SI, HI, and AVH's. Pt denies depression or anxiety.   Axis I: Alcohol Dependency  Axis II: Deferred Axis III:  Past Medical History  Diagnosis Date  . Hypertension   . Pneumonia   . EtOH dependence   . H/O ETOH abuse    Axis IV: other psychosocial or environmental problems, problems related to social environment, problems with access to health care services and problems with primary support group Axis V: 31-40 impairment in reality testing  Past Medical History:  Past Medical History  Diagnosis Date  . Hypertension   . Pneumonia   . EtOH dependence   . H/O ETOH abuse     Past Surgical History  Procedure Laterality Date  . Lung surgery  2011    put a tube in to drain fluid out of lungs from pneumonia    Family History:  Family History  Problem Relation Age of Onset  . Stroke Mother   . Lung cancer Father     Social History:  reports that he has been smoking Cigarettes.  He has been smoking about 0.25 packs per day. He does not have any smokeless tobacco history on file. He reports that he drinks about 12.6  ounces of alcohol per week. He reports that he does not use illicit drugs.  Additional Social History:  Alcohol / Drug Use Pain Medications: SEE MAR Prescriptions: SEE MAR Over the Counter: SEE MAR History of alcohol / drug use?: Yes Substance #1 Name of Substance 1: Alcohol  1 - Age of First Use: 47 yrs old  1 - Amount (size/oz): 6 pack of beer and a fifth of liqour 1 - Frequency: daily  1 - Duration: on-gong for the past 20 yrs  1 - Last Use / Amount: 10/29/2012 @ 3am this morning  CIWA: CIWA-Ar BP: 138/85 mmHg Pulse Rate: 71 Nausea and Vomiting: no nausea and no vomiting Tactile Disturbances: none Tremor: no tremor Auditory Disturbances: not present Paroxysmal Sweats: no sweat visible Visual Disturbances: not present Anxiety: no anxiety, at ease Headache, Fullness in Head: none present Agitation: normal activity Orientation and Clouding of Sensorium: oriented and can do serial additions CIWA-Ar Total: 0 COWS:    Allergies:  Allergies  Allergen Reactions  . Pine Hives    Home Medications:  (Not in a hospital admission)  OB/GYN Status:  No LMP for male patient.  General Assessment Data Location of Assessment: [facility] Is this a Tele or Face-to-Face Assessment?: Face-to-Face Is this an Initial Assessment or a Re-assessment for this encounter?: Initial Assessment Living Arrangements: Other (Comment) ("I live with  my sister sometimes") Can pt return to current living arrangement?: No Admission Status: Voluntary Is patient capable of signing voluntary admission?: Yes Transfer from: Acute Hospital Referral Source: Self/Family/Friend     Risk to self Suicidal Ideation: No Suicidal Intent: No Is patient at risk for suicide?: No Suicidal Plan?: No Access to Means: No What has been your use of drugs/alcohol within the last 12 months?:  (patient reports heavy alcohol use only ) Previous Attempts/Gestures: No How many times?:  (0) Other Self Harm Risks:   (n/a) Triggers for Past Attempts: Other (Comment) (no previous atttempts and/or gestures) Intentional Self Injurious Behavior: None Family Suicide History: No Recent stressful life event(s): Other (Comment) (patient does not identify any stressors) Persecutory voices/beliefs?: No Depression: No Substance abuse history and/or treatment for substance abuse?: No Suicide prevention information given to non-admitted patients: Not applicable  Risk to Others Homicidal Ideation: No Thoughts of Harm to Others: No Current Homicidal Intent: No Current Homicidal Plan: No Access to Homicidal Means: No Identified Victim:  (n/a) History of harm to others?: No Assessment of Violence: None Noted Does patient have access to weapons?: No Criminal Charges Pending?: No Does patient have a court date: No  Psychosis Hallucinations: None noted Delusions: None noted  Mental Status Report Appear/Hygiene: Disheveled Eye Contact: Good Motor Activity: Freedom of movement Speech: Logical/coherent Level of Consciousness: Alert Mood: Depressed Affect: Appropriate to circumstance Anxiety Level: None Thought Processes: Coherent;Relevant Judgement: Unimpaired Orientation: Person;Place;Time;Situation Obsessive Compulsive Thoughts/Behaviors: None  Cognitive Functioning Concentration: Decreased Memory: Recent Intact;Remote Intact IQ: Average Insight: Fair Impulse Control: Fair Appetite: Poor Weight Loss:  (none reported) Weight Gain:  (none reported) Sleep: Decreased Total Hours of Sleep:  (approx. 6 hours ) Vegetative Symptoms: None  ADLScreening [facility] Assessment Services) Patient's cognitive ability adequate to safely complete daily activities?: Yes Patient able to express need for assistance with ADLs?: Yes Independently performs ADLs?: No  Prior Inpatient Therapy Prior Inpatient Therapy: Yes Prior Therapy Dates:  ("over 9 yrs ago") Prior Therapy Facilty/Provider(s):  (patient unable to  recall the name of the facilty ) Reason for Treatment:  (detox)  Prior Outpatient Therapy Prior Outpatient Therapy: No Prior Therapy Dates:  (n/a) Prior Therapy Facilty/Provider(s):  (n/a) Reason for Treatment:  (n/a)  ADL Screening (condition at time of admission) Patient's cognitive ability adequate to safely complete daily activities?: Yes Is the patient deaf or have difficulty hearing?: No Does the patient have difficulty seeing, even when wearing glasses/contacts?: No Does the patient have difficulty concentrating, remembering, or making decisions?: Yes Patient able to express need for assistance with ADLs?: Yes Does the patient have difficulty dressing or bathing?: No Independently performs ADLs?: No Communication: Independent Dressing (OT): Independent Grooming: Independent Feeding: Independent Bathing: Independent Toileting: Independent In/Out Bed: Independent Walks in Home: Independent Does the patient have difficulty walking or climbing stairs?: No Weakness of Legs: None Weakness of Arms/Hands: None  Home Assistive Devices/Equipment Home Assistive Devices/Equipment: None    Abuse/Neglect Assessment (Assessment to be complete while patient is alone) Physical Abuse: Denies Verbal Abuse: Denies Sexual Abuse: Denies Exploitation of patient/patient's resources: Denies Self-Neglect: Denies Values / Beliefs Cultural Requests During Hospitalization: None Spiritual Requests During Hospitalization: None   Advance Directives (For Healthcare) Advance Directive: Patient does not have advance directive Nutrition Screen- [facility] Adult/[facility] Patient's home diet: Regular  Additional Information 1:1 In Past 12 Months?: Yes CIRT Risk: No Elopement Risk: No Does patient have medical clearance?: No     Disposition:  Disposition Initial Assessment Completed for this Encounter: Yes  Disposition of Patient: Inpatient treatment program Type of inpatient treatment program:  Adult  On Site Evaluation by:   Reviewed with Physician:    Melynda Ripple [facility] 10/29/2012 6:28 PM

## 2012-10-29 NOTE — BHH Counselor (Signed)
Pt accepted to [facility] per [place]; pending completion of authorization. Once the authorization is obtained by ACT/SW [facility] will need to be called and updated.    This is to be completed by the am ACT/SW  10/29/2012.  ** Per Andrey Campanile, authorizations are NOT to be completed until the day that the patient comes to the facility.  If they are completed on the day before the patient is expected to come a day will be lost.  SW will need to make transport arrangements for this patient. Per patient, he has a Valid ID in his belongings to catch the train/bus.

## 2012-10-29 NOTE — Progress Notes (Addendum)
PA discussed case with CM Pt confirms no pcp Reports he is familiar with [facility] ([facility] [place] only.  Reports no pcp locally Pt and CM discussed ACT consult pending

## 2012-10-29 NOTE — ED Notes (Signed)
Patients clothing placed in TCU locker #30

## 2012-10-30 ENCOUNTER — Encounter (HOSPITAL_COMMUNITY): Payer: Self-pay | Admitting: Emergency Medicine

## 2012-10-30 ENCOUNTER — Inpatient Hospital Stay (HOSPITAL_COMMUNITY)
Admission: EM | Admit: 2012-10-30 | Discharge: 2012-11-01 | DRG: 897 | Disposition: A | Discharge status: Home / Self Care | Payer: No Typology Code available for payment source | Source: Intra-hospital | Attending: Psychiatry | Admitting: Psychiatry

## 2012-10-30 DIAGNOSIS — F329 Major depressive disorder, single episode, unspecified: Secondary | ICD-10-CM

## 2012-10-30 DIAGNOSIS — F3289 Other specified depressive episodes: Secondary | ICD-10-CM | POA: Diagnosis present

## 2012-10-30 DIAGNOSIS — F411 Generalized anxiety disorder: Secondary | ICD-10-CM | POA: Diagnosis present

## 2012-10-30 DIAGNOSIS — F1994 Other psychoactive substance use, unspecified with psychoactive substance-induced mood disorder: Secondary | ICD-10-CM | POA: Diagnosis present

## 2012-10-30 DIAGNOSIS — F1020 Alcohol dependence, uncomplicated: Principal | ICD-10-CM | POA: Diagnosis present

## 2012-10-30 DIAGNOSIS — F39 Unspecified mood [affective] disorder: Secondary | ICD-10-CM

## 2012-10-30 DIAGNOSIS — I10 Essential (primary) hypertension: Secondary | ICD-10-CM | POA: Diagnosis present

## 2012-10-30 DIAGNOSIS — F1010 Alcohol abuse, uncomplicated: Secondary | ICD-10-CM

## 2012-10-30 MED ORDER — HYDROXYZINE HCL 25 MG PO TABS: 25.0000 mg | ORAL_TABLET | Freq: Four times a day (QID) | ORAL | Status: DC | PRN

## 2012-10-30 MED ORDER — CHLORDIAZEPOXIDE HCL 25 MG PO CAPS: 25.0000 mg | ORAL_CAPSULE | Freq: Four times a day (QID) | ORAL | Status: DC | PRN

## 2012-10-30 MED ORDER — CHLORDIAZEPOXIDE HCL 25 MG PO CAPS
25.0000 mg | ORAL_CAPSULE | ORAL | Status: DC
  Administered 2012-11-01: 25 mg via ORAL
  Filled 2012-10-30: qty 1

## 2012-10-30 MED ORDER — MAGNESIUM HYDROXIDE 400 MG/5ML PO SUSP: 30.0000 mL | Freq: Every day | ORAL | Status: DC | PRN

## 2012-10-30 MED ORDER — CHLORDIAZEPOXIDE HCL 25 MG PO CAPS: 25.0000 mg | ORAL_CAPSULE | Freq: Every day | ORAL | Status: DC

## 2012-10-30 MED ORDER — TRAZODONE HCL 50 MG PO TABS
50.0000 mg | ORAL_TABLET | Freq: Every evening | ORAL | Status: DC | PRN
  Administered 2012-10-30 – 2012-10-31 (×2): 50 mg via ORAL
  Filled 2012-10-30 (×4): qty 1
  Filled 2012-10-30: qty 28
  Filled 2012-10-30: qty 1
  Filled 2012-10-30: qty 28
  Filled 2012-10-30: qty 1

## 2012-10-30 MED ORDER — THIAMINE HCL 100 MG/ML IJ SOLN: 100.0000 mg | Freq: Once | INTRAMUSCULAR | Status: DC

## 2012-10-30 MED ORDER — VITAMIN B-1 100 MG PO TABS
100.0000 mg | ORAL_TABLET | Freq: Every day | ORAL | Status: DC
  Administered 2012-10-31 – 2012-11-01 (×2): 100 mg via ORAL
  Filled 2012-10-30 (×4): qty 1

## 2012-10-30 MED ORDER — CHLORDIAZEPOXIDE HCL 25 MG PO CAPS
25.0000 mg | ORAL_CAPSULE | Freq: Four times a day (QID) | ORAL | Status: AC
  Administered 2012-10-30 (×4): 25 mg via ORAL
  Filled 2012-10-30 (×3): qty 1

## 2012-10-30 MED ORDER — ADULT MULTIVITAMIN W/MINERALS CH
1.0000 | ORAL_TABLET | Freq: Every day | ORAL | Status: DC
  Administered 2012-10-30 – 2012-11-01 (×3): 1 via ORAL
  Filled 2012-10-30 (×5): qty 1

## 2012-10-30 MED ORDER — NICOTINE 14 MG/24HR TD PT24
14.0000 mg | MEDICATED_PATCH | Freq: Every day | TRANSDERMAL | Status: DC
  Administered 2012-10-30 – 2012-11-01 (×3): 14 mg via TRANSDERMAL
  Filled 2012-10-30 (×6): qty 1

## 2012-10-30 MED ORDER — ONDANSETRON 4 MG PO TBDP
4.0000 mg | ORAL_TABLET | Freq: Four times a day (QID) | ORAL | Status: DC | PRN
  Administered 2012-10-30: 4 mg via ORAL
  Filled 2012-10-30: qty 1

## 2012-10-30 MED ORDER — ALUM & MAG HYDROXIDE-SIMETH 200-200-20 MG/5ML PO SUSP: 30.0000 mL | ORAL | Status: DC | PRN

## 2012-10-30 MED ORDER — ACETAMINOPHEN 325 MG PO TABS: 650.0000 mg | ORAL_TABLET | Freq: Four times a day (QID) | ORAL | Status: DC | PRN

## 2012-10-30 MED ORDER — LOPERAMIDE HCL 2 MG PO CAPS: 2.0000 mg | ORAL_CAPSULE | ORAL | Status: DC | PRN

## 2012-10-30 MED ORDER — CHLORDIAZEPOXIDE HCL 25 MG PO CAPS
25.0000 mg | ORAL_CAPSULE | Freq: Three times a day (TID) | ORAL | Status: AC
  Administered 2012-10-31 (×3): 25 mg via ORAL
  Filled 2012-10-30 (×4): qty 1

## 2012-10-30 NOTE — BHH Group Notes (Signed)
[facility] LCSW Aftercare Discharge Planning Group Note   10/30/2012 9:28 AM  Participation Quality:  Appropriate   Mood/Affect:  Appropriate  Depression Rating:  0  Anxiety Rating:  0  Thoughts of Suicide:  No Will you contract for safety?   NA  Current AVH:  No  Plan for Discharge/Comments:  Pt plans to go to [facility] after d/c. He states that he does not take mental health meds and has no need to go to [place]. He lives with his sister currently. Pt reports withdrawal symptoms as 5 today. CSW to call for [facility] admission date.   Transportation Means: unknown/family/bus pass  Supports: Network engineer, [employer]

## 2012-10-30 NOTE — Progress Notes (Signed)
Patient ID: Jonathon Hunt, male   DOB: [birth year], 47 y.o.   MRN: [id number] Report given to Casimiro Needle, RN.

## 2012-10-30 NOTE — Progress Notes (Signed)
Patient ID: Jonathon Hunt, male   DOB: 10/14/1965, 47 y.o.   MRN: [id number]  Admission Note: Patient is a 47 yo male admitted for ETOH abuse and detox. Pt states he has been drinking a case of beer per day for over 20 years, with the longest period of sobriety lasting a day. Pt endorses depression with dull, flat affect. Pt denies SI/HI or hallucinations at this time. Pt verbally contracts for safety. Pt pleasant and cooperative during admission. Pt states he wants to "get my life straight" so he can find a job and get his own place. Q 15 minute safety checks initiated per policy.

## 2012-10-30 NOTE — Tx Team (Signed)
Interdisciplinary Treatment Plan Update (Adult)  Date: 10/30/2012   Time Reviewed: 10:03 AM  Progress in Treatment:  Attending groups: Yes Participating in groups:  Yes Taking medication as prescribed: Yes  Tolerating medication: Yes  Family/Significant othe contact made: No, SPE not required for this pt.  Patient understands diagnosis: Yes, AEB seeking treatment for ETOH detox and medication stabilization.  Discussing patient identified problems/goals with staff: Yes  Medical problems stabilized or resolved: Yes  Denies suicidal/homicidal ideation: Yes during admission, group, and self report. Patient has not harmed self or Others: Yes  New problem(s) identified: n/a  Discharge Plan or Barriers: Pt plans to go to [facility]. He reports that he will not be on meds when he leaves, but will followup at [facility] if necessary. He currently lives with his sister in [place], [place]. Additional comments: Jonathon Hunt is an 47 y.o. male with history of alcohol abuse presenting to [facility] for medical clearance. Pt requesting alcohol detox today. His last drink was 3 am this morning. Patient reports heavy alcohol use for the last 15-20 years. Patient drinking a 6 pack of beer daily and a fifth of liquor. Patient does not have a history of seizures, black outs, or DT's. His only withdrawal symptoms at this time is "slight tremors". Patient completed a detox program over 9 yrs ago. His longest period of sobriety is "a few days". He is motivated to stop drinking so that he may obtain employment. Pt also verbalizes that he would like to participate in a residential program such as [facility] following the completion of his detox. Pt denies recreational drug use. His UDS is negative for drugs. Per ED, notes patient did report occasional THC use.  Reason for Continuation of Hospitalization: Librium taper-withdrawals Mood stabilization Medication management Estimated length of stay: 2-3 days For review  of initial/current patient goals, please see plan of care.  Attendees:  Patient:    Family:    Physician: Geoffery Lyons MD 10/30/2012 10:02 AM   Nursing: Lupita Leash RN 10/30/2012 10:02 AM   Clinical Social Worker Semone Orlov Smart, LCSWA  10/30/2012 10:02 AM   Other: Aggie PA 10/30/2012 10:02 AM   Other:    Other: Darden Dates Nurse CM 10/30/2012 10:03 AM   Other:    Scribe for Treatment Team:  [employer] LCSWA 10/30/2012 10:03 AM

## 2012-10-30 NOTE — Progress Notes (Signed)
Adult Psychoeducational Group Note  Date:  10/30/2012 Time:  6:31 PM  Group Topic/Focus:  Personal Choices and Values:   The focus of this group is to help patients assess and explore the importance of values in their lives, how their values affect their decisions, how they express their values and what opposes their expression.  Participation Level:  Minimal  Participation Quality:  Appropriate  Affect:  Flat  Cognitive:  Alert and Oriented  Insight: Improving and Lacking  Engagement in Group:  Improving  Modes of Intervention:  Discussion, Education, Exploration and Support  Additional Comments:  Pt attended group and identified being in control as a personal value he wants to focus on. He didn't identify the factors he wants to be in control.  Reynolds Bowl 10/30/2012, 6:31 PM

## 2012-10-30 NOTE — Consult Note (Signed)
Jonathon Hunt Psychiatry Consult   Reason for Consult:  Eval for IP psychiatric mgmt Referring Physician:  [facility] EDP Jonathon Hunt is an 47 y.o. male.  Assessment: AXIS I:  Alcohol Abuse and Mood Disorder NOS AXIS II:  No diagnosis AXIS III:   Past Medical History  Diagnosis Date  . Hypertension   . Pneumonia   . EtOH dependence   . H/O ETOH abuse    AXIS IV:  other psychosocial or environmental problems AXIS V:  21-30 behavior considerably influenced by delusions or hallucinations OR serious impairment in judgment, communication OR inability to function in almost all areas  Plan:  Recommend psychiatric Inpatient admission when medically cleared.  Subjective:   Jonathon Hunt is a 47 y.o. male patient presenting voluntarily to the [facility] ED endorsing depressive sx along with concurrent ETOH abuse. Patient state he consumes 6 pack of beer daily along with a fifth of liquor. Patient denies any illicit drug use. Patient also denies any hx of seizures and or DT's with prior attempts with alcohol cessation. The patient endorses passive SI generally pronounced when sobering up but denies SA/AVH/HI, delusional thoughts or paranoia. The patient states that he cannot contract for safety at this time.  HPI:  HPI Elements:     Past Psychiatric History: Past Medical History  Diagnosis Date  . Hypertension   . Pneumonia   . EtOH dependence   . H/O ETOH abuse     reports that he has been smoking Cigarettes.  He has been smoking about 0.25 packs per day. He does not have any smokeless tobacco history on file. He reports that he drinks about 12.6 ounces of alcohol per week. He reports that he does not use illicit drugs. Family History  Problem Relation Age of Onset  . Stroke Mother   . Lung cancer Father    Family History Substance Abuse: No Family Supports: No Living Arrangements: Other (Comment) ("I live with my sister sometimes") Can pt return to current living arrangement?: No Abuse/Neglect  [facility]) Physical Abuse: Denies Verbal Abuse: Denies Sexual Abuse: Denies Allergies:   Allergies  Allergen Reactions  . Pine Hives    Past Psychiatric History: Diagnosis:  MDD with alcohol dependence  Hospitalizations:  Prior hospitalizations for Alcohol abuse  Outpatient Care:  n/a  Substance Abuse Care:  none  Self-Mutilation:  none  Suicidal Attempts:  no  Violent Behaviors:  no   Objective: Blood pressure 129/84, pulse 67, temperature 98.6 F (37 C), temperature source Oral, resp. rate 16, height 5\' 10"  (1.778 m), weight 77.111 kg (170 lb), SpO2 99.00%.Body mass index is 24.39 kg/(m^2). Results for orders placed during the Hunt encounter of 10/29/12 (from the past 72 hour(s))  ACETAMINOPHEN LEVEL     Status: None   Collection Time    10/29/12 11:48 AM      Result Value Range   Acetaminophen (Tylenol), Serum <15.0  10 - 30 ug/mL   Comment:            THERAPEUTIC CONCENTRATIONS VARY     SIGNIFICANTLY. A RANGE OF 10-30     ug/mL MAY BE AN EFFECTIVE     CONCENTRATION FOR MANY PATIENTS.     HOWEVER, SOME ARE BEST TREATED     AT CONCENTRATIONS OUTSIDE THIS     RANGE.     ACETAMINOPHEN CONCENTRATIONS     >150 ug/mL AT 4 HOURS AFTER     INGESTION AND >50 ug/mL AT 12     HOURS AFTER INGESTION  ARE     OFTEN ASSOCIATED WITH TOXIC     REACTIONS.  CBC     Status: None   Collection Time    10/29/12 11:48 AM      Result Value Range   WBC 9.2  4.0 - 10.5 K/uL   RBC 5.10  4.22 - 5.81 MIL/uL   Hemoglobin 15.9  13.0 - 17.0 g/dL   HCT 16.1  09.6 - 04.5 %   MCV 87.1  78.0 - 100.0 fL   MCH 31.2  26.0 - 34.0 pg   MCHC 35.8  30.0 - 36.0 g/dL   RDW 40.9  81.1 - 91.4 %   Platelets 198  150 - 400 K/uL  COMPREHENSIVE METABOLIC PANEL     Status: Abnormal   Collection Time    10/29/12 11:48 AM      Result Value Range   Sodium 134 (*) 135 - 145 mEq/L   Potassium 3.9  3.5 - 5.1 mEq/L   Chloride 97  96 - 112 mEq/L   CO2 24  19 - 32 mEq/L   Glucose, Bld 103 (*) 70 - 99 mg/dL   BUN  5 (*) 6 - 23 mg/dL   Creatinine, Ser 7.82  0.50 - 1.35 mg/dL   Calcium 9.5  8.4 - 95.6 mg/dL   Total Protein 7.6  6.0 - 8.3 g/dL   Albumin 3.8  3.5 - 5.2 g/dL   AST 29  0 - 37 U/L   ALT 16  0 - 53 U/L   Alkaline Phosphatase 61  39 - 117 U/L   Total Bilirubin 0.4  0.3 - 1.2 mg/dL   GFR calc non Af Amer >90  >90 mL/min   GFR calc Af Amer >90  >90 mL/min   Comment:            The eGFR has been calculated     using the CKD EPI equation.     This calculation has not been     validated in all clinical     situations.     eGFR's persistently     <90 mL/min signify     possible Chronic Kidney Disease.  ETHANOL     Status: Abnormal   Collection Time    10/29/12 11:48 AM      Result Value Range   Alcohol, Ethyl (B) 167 (*) 0 - 11 mg/dL   Comment:            LOWEST DETECTABLE LIMIT FOR     SERUM ALCOHOL IS 11 mg/dL     FOR MEDICAL PURPOSES ONLY  SALICYLATE LEVEL     Status: Abnormal   Collection Time    10/29/12 11:48 AM      Result Value Range   Salicylate Lvl <2.0 (*) 2.8 - 20.0 mg/dL  URINE RAPID DRUG SCREEN (HOSP PERFORMED)     Status: None   Collection Time    10/29/12 12:47 PM      Result Value Range   Opiates NONE DETECTED  NONE DETECTED   Cocaine NONE DETECTED  NONE DETECTED   Benzodiazepines NONE DETECTED  NONE DETECTED   Amphetamines NONE DETECTED  NONE DETECTED   Tetrahydrocannabinol NONE DETECTED  NONE DETECTED   Barbiturates NONE DETECTED  NONE DETECTED   Comment:            DRUG SCREEN FOR MEDICAL PURPOSES     ONLY.  IF CONFIRMATION IS NEEDED     FOR ANY PURPOSE, NOTIFY LAB  WITHIN 5 DAYS.                LOWEST DETECTABLE LIMITS     FOR URINE DRUG SCREEN     Drug Class       Cutoff (ng/mL)     Amphetamine      1000     Barbiturate      200     Benzodiazepine   200     Tricyclics       300     Opiates          300     Cocaine          300     THC              50   Labs are reviewed and are pertinent for ( BAL of 167, no critical lab values  noted)  Current Facility-Administered Medications  Medication Dose Route Frequency Provider Last Rate Last Dose  . LORazepam (ATIVAN) tablet 0-4 mg  0-4 mg Oral Q6H Fayrene Helper, PA-C   1 mg at 10/30/12 0007   Followed by  . [START ON 10/31/2012] LORazepam (ATIVAN) tablet 0-4 mg  0-4 mg Oral Q12H Fayrene Helper, PA-C      . thiamine (VITAMIN B-1) tablet 100 mg  100 mg Oral Daily Fayrene Helper, PA-C   100 mg at 10/29/12 1634   Or  . thiamine (B-1) injection 100 mg  100 mg Intravenous Daily Fayrene Helper, PA-C       Current Outpatient Prescriptions  Medication Sig Dispense Refill  . Aspirin-Acetaminophen-Caffeine (GOODYS EXTRA STRENGTH) 520-260-32.5 MG PACK Take 2 packets by mouth every 6 (six) hours as needed (For headache.).       Marland Kitchen diphenhydrAMINE (BENADRYL) 25 MG tablet Take 25-50 mg by mouth every 6 (six) hours as needed for allergies.       Marland Kitchen tetrahydrozoline 0.05 % ophthalmic solution Place 2-3 drops into both eyes 4 (four) times daily as needed (For allergies.).        Psychiatric Specialty Exam:     Blood pressure 129/84, pulse 67, temperature 98.6 F (37 C), temperature source Oral, resp. rate 16, height 5\' 10"  (1.778 m), weight 77.111 kg (170 lb), SpO2 99.00%.Body mass index is 24.39 kg/(m^2).  General Appearance: Disheveled  Eye Solicitor::  Fair  Speech:  Normal Rate  Volume:  Normal  Mood:  Depressed  Affect:  Congruent  Thought Process:  Circumstantial  Orientation:  Full (Time, Place, and Person)  Thought Content:  Negative  Suicidal Thoughts:  Yes.  without intent/plan  Homicidal Thoughts:  No  Memory:  Immediate;   Good  Judgement:  Impaired  Insight:  Lacking  Psychomotor Activity:  Negative  Concentration:  Fair  Recall:  Fair  Akathisia:  Negative  Handed:  Right  AIMS (if indicated):     Assets:  Desire for Improvement  Sleep:      Treatment Plan Summary: 1) Admit to [facility] hall for crises mgmt, safety and stabilization of Alcohol induced MDD 2) Social work to aid  in OP support services and psychiatric care to decrease chance for relapse and readmission 3) Administration of psychotropics and psychotherapeutic interventions 4) Mgmt of applicable co-morbid conditons  Italo Banton E 10/30/2012 1:12 AM

## 2012-10-30 NOTE — ED Notes (Signed)
NAD noted upon discharge.

## 2012-10-30 NOTE — Progress Notes (Signed)
D: Pt is quiet with very little interaction with peers. Pleasant & cooperative.Asked for nic. Patch & 14mg  was applied.Pt c/o nausea & was medicated with Zofran 4mg .CIWA = 5 A : Supported & encouraged. Continues on 15 minute checks.R: Pt safety maintained.

## 2012-10-30 NOTE — Tx Team (Signed)
Initial Interdisciplinary Treatment Plan  PATIENT STRENGTHS: (choose at least two) Average or above average intelligence Capable of independent living Communication skills Motivation for treatment/growth Supportive family/friends  PATIENT STRESSORS: Financial difficulties Substance abuse   PROBLEM LIST: Problem List/Patient Goals Date to be addressed Date deferred Reason deferred Estimated date of resolution  Depression 10/30/12     Substance Abuse 10/30/12                                                DISCHARGE CRITERIA:  Adequate post-discharge living arrangements Motivation to continue treatment in a less acute level of care Verbal commitment to aftercare and medication compliance Withdrawal symptoms are absent or subacute and managed without 24-hour nursing intervention  PRELIMINARY DISCHARGE PLAN: Outpatient therapy  PATIENT/FAMIILY INVOLVEMENT: This treatment plan has been presented to and reviewed with the patient, Jonathon Hunt, and/or family member.  The patient and family have been given the opportunity to ask questions and make suggestions.  Jonathon Hunt 10/30/2012, 4:42 AM

## 2012-10-30 NOTE — B[facility] Counselor (Signed)
Adult Comprehensive Assessment  Patient ID: Jonathon Hunt, male   DOB: November 01, 1965, 47 y.o.   MRN: [id number]  Information Source: Information source: Patient  Current Stressors:  Educational / Learning stressors: N/A Employment / Job issues: N/A Family Relationships: N/A Surveyor, quantity / Lack of resources (include bankruptcy): N/A Housing / Lack of housing: staying with sister temporarily Physical health (include injuries & life threatening diseases): N/A Social relationships: N/A Substance abuse: Alcohol Abuse Bereavement / Loss: sister in law passed away 1 month ago  Living/Environment/Situation:  Living Arrangements: Alone Living conditions (as described by patient or guardian): Pt states that he is staying with sister temporarily in [place].  Pt states that this is a good environment. How long has patient lived in current situation?: 6 weeks What is atmosphere in current home: Temporary;Supportive;Loving;Comfortable  Family History:  Marital status: Widowed Widowed, when?: 5-6 years ago Does patient have children?: Yes How many children?: 3 How is patient's relationship with their children?: Pt states that he has a good relationship with children.    Childhood History:  By whom was/is the patient raised?: Both parents Additional childhood history information: Pt states that he had a good childhood.  Description of patient's relationship with caregiver when they were a child: Pt states that he got along well with parents growing up. Patient's description of current relationship with people who raised him/her: Both parents are deceased.   Does patient have siblings?: Yes Number of Siblings: 11 Description of patient's current relationship with siblings: Pt states that he is close to 3 brothers and close to 2 sisters, living with one.   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 long has patient been employed?: "years" Patient's job has been impacted by current illness: No What is the longest time patient has a held a job?: 13 years Where was the patient employed at that time?: [employer] Has patient ever been in the [employer]?: No Has patient ever served in combat?: No  Financial Resources:   Financial resources: Income from employment;Food stamps Does patient have a representative payee or guardian?: No  Alcohol/Substance Abuse:   What has been your use of drugs/alcohol within the last 12 months?: Alcohol - 1/5 of liquor and 6 pack of beer daily at this rate for the past 3 years.   If attempted suicide, did drugs/alcohol play a role in this?: No Alcohol/Substance Abuse Treatment Hx: Attends AA/NA If yes, describe treatment: Went to [facility] for a screening and sent him here for detox. Has alcohol/substance abuse ever caused legal problems?: Yes (DUI 8 years ago)  Social Support System:   Patient's Community Support System: Good Describe Community Support System: Pt states that family is supportive. Type of faith/religion: Methodist How does patient's faith help to cope with current illness?: church attendance, prayer, volunteer work  Financial trader:   Leisure and Hobbies: basketball, fishing  Strengths/Needs:   What things does the patient do well?: Pt states that he is good at basketball and  fishing In what areas does patient struggle / problems for  patient: Alcohol abuse, detox  Discharge Plan:   Does patient have access to transportation?: Yes (utilizes public transportation) Will patient be returning to same living situation after discharge?: Yes Currently receiving community mental health services: No If no, would patient like referral for services when discharged?: Yes (What county?) [facility]) Does patient have financial barriers related to discharge medications?: No  Summary/Recommendations:     Patient is a 47 year old African American Male with a diagnosis of Alcohol Dependence.  Patient lives in [place] with sister, temporarily.  Pt states that he think it's time to stop drinking so he came here for detox.  Pt states that he went to [facility] first for a screening but was sent here to detox.  Pt is interested in getting further treatment after detox.  Patient will benefit from crisis stabilization, medication evaluation, group therapy and psycho education in addition to case management for discharge planning.    Horton, Salome Arnt. 10/30/2012

## 2012-10-30 NOTE — H&P (Signed)
Psychiatric Admission Assessment Adult  Patient Identification:  Jonathon Hunt Date of Evaluation:  10/30/2012 Chief Complaint:  Alcohol Dependence History of Present Illness:: States he got tired of drinking. "drinks like crazy." Drinks a fith or a six pack or combines them. Drinks every day. Has been drinking for 25-30 years. He experiences withdrawal when he does not drink. Alcohol has kept him from jobs and other activities. His longest period of sobriety has been few days. He went to a program for 3 days 20 years ago. He did not pursue any further work on his recovery. His wife died of renal failure several years ago. It is still hard. He has had couple of relationships but nothing serious. He does not have a stable place to live right now. He wants to abstain. He admits to depression and anxiety.  Elements:  Location:  in patient. Quality:  unable to function. Severity:  severe. Timing:  every day. Duration:  last 20 years. Context:  alcohol dependence,underlying anxiety derpession unable to accomplish long term abstinence. Associated Signs/Synptoms: Depression Symptoms:  depressed mood, anhedonia, insomnia, fatigue, difficulty concentrating, anxiety, insomnia, loss of energy/fatigue, disturbed sleep, (Hypo) Manic Symptoms:  Denies Anxiety Symptoms:  Excessive Worry, Psychotic Symptoms:  Denies PTSD Symptoms: Negative  Psychiatric Specialty Exam: Physical Exam  Review of Systems  Constitutional: Positive for malaise/fatigue.  Eyes: Negative.   Respiratory: Positive for cough, sputum production and shortness of breath.        Half a pack, sinus, allergies  Gastrointestinal: Positive for heartburn, nausea, vomiting and diarrhea.  Genitourinary: Negative.   Musculoskeletal: Positive for back pain.  Skin: Negative.   Neurological: Positive for headaches.  Endo/Heme/Allergies: Negative.   Psychiatric/Behavioral: Positive for depression and substance abuse. The patient is  nervous/anxious and has insomnia.     Blood pressure 127/91, pulse 95, temperature 98 F (36.7 C), resp. rate 16.There is no weight on file to calculate BMI.  General Appearance: Fairly Groomed  Patent attorney::  Fair  Speech:  Slow and not spontaneous  Volume:  Decreased  Mood:  Anxious and Depressed  Affect:  Restricted  Thought Process:  Coherent and Goal Directed  Orientation:  Full (Time, Place, and Person)  Thought Content:  worries, concerns  Suicidal Thoughts:  No  Homicidal Thoughts:  No  Memory:  Immediate;   Fair Recent;   Fair Remote;   Fair  Judgement:  Fair  Insight:  superficial  Psychomotor Activity:  Restlessness  Concentration:  Fair  Recall:  Fair  Akathisia:  No  Handed:  Right  AIMS (if indicated):     Assets:  Desire for Improvement  Sleep:  Number of Hours: 1.25    Past Psychiatric History: Diagnosis: Alcohol Dependence, Substance Induced Mood Disorder, Depressive Disorder NOS, Anxiety Disorder NOS  Hospitalizations: Denies  Outpatient Care: Denies  Substance Abuse Care: [facility]?  20 years ago fro detox  Self-Mutilation: Denies  Suicidal Attempts:Denies  Violent Behaviors: Denies   Past Medical History:   Past Medical History  Diagnosis Date  . Hypertension   . Pneumonia   . EtOH dependence   . H/O ETOH abuse     Allergies:   Allergies  Allergen Reactions  . Pine Hives   PTA Medications: Prescriptions prior to admission  Medication Sig Dispense Refill  . Aspirin-Acetaminophen-Caffeine (GOODYS EXTRA STRENGTH) 520-260-32.5 MG PACK Take 2 packets by mouth every 6 (six) hours as needed (For headache.).       Marland Kitchen diphenhydrAMINE (BENADRYL) 25 MG tablet Take 25-50 mg  by mouth every 6 (six) hours as needed for allergies.       Marland Kitchen tetrahydrozoline 0.05 % ophthalmic solution Place 2-3 drops into both eyes 4 (four) times daily as needed (For allergies.).        Previous Psychotropic Medications:  Medication/Dose                 Substance  Abuse History in the last 12 months:  yes  Consequences of Substance Abuse: Legal Consequences:  DWI in the past Blackouts:   Withdrawal Symptoms:   Diaphoresis Diarrhea Nausea Tremors  Social History:  reports that he has been smoking Cigarettes.  He has a 7.5 pack-year smoking history. He does not have any smokeless tobacco history on file. He reports that  drinks alcohol. He reports that he does not use illicit drugs. Additional Social History: History of alcohol / drug use?: Yes Longest period of sobriety (when/how long): 1 day Negative Consequences of Use: Financial;Personal relationships Withdrawal Symptoms: Tremors                    Current Place of Residence:  Lives here and there mostly with sister Place of Birth:   Family Members: Marital Status:  Widowed Kidney Failure Children:  Sons: 21  Daughters:20, 34  Relationships: Education:  GED, Engineer, technical sales Problems/Performance: Religious Beliefs/Practices: Active in [place] History of Abuse (Emotional/Phsycial/Sexual) Denies Occupational Experiences; 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 Legal History: 2 DWI in the past Hobbies/Interests:  Family History:   Family History  Problem Relation Age of Onset  . Stroke Mother   . Lung cancer Father     Results for orders placed during the hospital encounter of 10/29/12 (from the past 72 hour(s))  ACETAMINOPHEN LEVEL     Status: None   Collection Time    10/29/12 11:48 AM      Result Value Range   Acetaminophen (Tylenol), Serum <15.0  10 - 30 ug/mL   Comment:            THERAPEUTIC CONCENTRATIONS VARY     SIGNIFICANTLY. A RANGE OF 10-30     ug/mL MAY BE AN EFFECTIVE     CONCENTRATION FOR MANY PATIENTS.     HOWEVER, SOME ARE BEST TREATED     AT CONCENTRATIONS OUTSIDE THIS     RANGE.     ACETAMINOPHEN CONCENTRATIONS     >150 ug/mL AT 4 HOURS AFTER     INGESTION AND >50 ug/mL AT 12     HOURS AFTER INGESTION ARE     OFTEN  ASSOCIATED WITH TOXIC     REACTIONS.  CBC     Status: None   Collection Time    10/29/12 11:48 AM      Result Value Range   WBC 9.2  4.0 - 10.5 K/uL   RBC 5.10  4.22 - 5.81 MIL/uL   Hemoglobin 15.9  13.0 - 17.0 g/dL   HCT 47.8  29.5 - 62.1 %   MCV 87.1  78.0 - 100.0 fL   MCH 31.2  26.0 - 34.0 pg   MCHC 35.8  30.0 - 36.0 g/dL   RDW 30.8  65.7 - 84.6 %   Platelets 198  150 - 400 K/uL  COMPREHENSIVE METABOLIC PANEL     Status: Abnormal   Collection Time    10/29/12 11:48 AM      Result Value Range   Sodium 134 (*) 135 - 145 mEq/L   Potassium 3.9  3.5 -  5.1 mEq/L   Chloride 97  96 - 112 mEq/L   CO2 24  19 - 32 mEq/L   Glucose, Bld 103 (*) 70 - 99 mg/dL   BUN 5 (*) 6 - 23 mg/dL   Creatinine, Ser 1.61  0.50 - 1.35 mg/dL   Calcium 9.5  8.4 - 09.6 mg/dL   Total Protein 7.6  6.0 - 8.3 g/dL   Albumin 3.8  3.5 - 5.2 g/dL   AST 29  0 - 37 U/L   ALT 16  0 - 53 U/L   Alkaline Phosphatase 61  39 - 117 U/L   Total Bilirubin 0.4  0.3 - 1.2 mg/dL   GFR calc non Af Amer >90  >90 mL/min   GFR calc Af Amer >90  >90 mL/min   Comment:            The eGFR has been calculated     using the CKD EPI equation.     This calculation has not been     validated in all clinical     situations.     eGFR's persistently     <90 mL/min signify     possible Chronic Kidney Disease.  ETHANOL     Status: Abnormal   Collection Time    10/29/12 11:48 AM      Result Value Range   Alcohol, Ethyl (B) 167 (*) 0 - 11 mg/dL   Comment:            LOWEST DETECTABLE LIMIT FOR     SERUM ALCOHOL IS 11 mg/dL     FOR MEDICAL PURPOSES ONLY  SALICYLATE LEVEL     Status: Abnormal   Collection Time    10/29/12 11:48 AM      Result Value Range   Salicylate Lvl <2.0 (*) 2.8 - 20.0 mg/dL  URINE RAPID DRUG SCREEN (HOSP PERFORMED)     Status: None   Collection Time    10/29/12 12:47 PM      Result Value Range   Opiates NONE DETECTED  NONE DETECTED   Cocaine NONE DETECTED  NONE DETECTED   Benzodiazepines NONE DETECTED   NONE DETECTED   Amphetamines NONE DETECTED  NONE DETECTED   Tetrahydrocannabinol NONE DETECTED  NONE DETECTED   Barbiturates NONE DETECTED  NONE DETECTED   Comment:            DRUG SCREEN FOR MEDICAL PURPOSES     ONLY.  IF CONFIRMATION IS NEEDED     FOR ANY PURPOSE, NOTIFY LAB     WITHIN 5 DAYS.                LOWEST DETECTABLE LIMITS     FOR URINE DRUG SCREEN     Drug Class       Cutoff (ng/mL)     Amphetamine      1000     Barbiturate      200     Benzodiazepine   200     Tricyclics       300     Opiates          300     Cocaine          300     THC              50   Psychological Evaluations:  Assessment:   AXIS I:  Alcohol Dependence/Depressive Disorder NOS, Anxiety Disorder NOS AXIS II:  Deferred AXIS III:   Past Medical  History  Diagnosis Date  . Hypertension   . Pneumonia   . EtOH dependence   . H/O ETOH abuse    AXIS IV:  occupational problems and other psychosocial or environmental problems AXIS V:  41-50 serious symptoms  Treatment Plan/Recommendations:  Supportive approach/coping skills/relapse prevention                                                                 Detox                                                                 Reassess and address the co morbidities                                                                 Facilitate being admitted to a residential treatment program  Treatment Plan Summary: Daily contact with patient to assess and evaluate symptoms and progress in treatment Medication management Current Medications:  Current Facility-Administered Medications  Medication Dose Route Frequency Provider Last Rate Last Dose  . acetaminophen (TYLENOL) tablet 650 mg  650 mg Oral Q6H PRN Kerry Hough, PA-C      . alum & mag hydroxide-simeth (MAALOX/MYLANTA) 200-200-20 MG/5ML suspension 30 mL  30 mL Oral Q4H PRN Kerry Hough, PA-C      . chlordiazePOXIDE (LIBRIUM) capsule 25 mg  25 mg Oral Q6H PRN Kerry Hough, PA-C       . chlordiazePOXIDE (LIBRIUM) capsule 25 mg  25 mg Oral QID Kerry Hough, PA-C   25 mg at 10/30/12 0810   Followed by  . [START ON 10/31/2012] chlordiazePOXIDE (LIBRIUM) capsule 25 mg  25 mg Oral TID Kerry Hough, PA-C       Followed by  . [START ON 11/01/2012] chlordiazePOXIDE (LIBRIUM) capsule 25 mg  25 mg Oral BH-qamhs Spencer E Simon, PA-C       Followed by  . [START ON 11/02/2012] chlordiazePOXIDE (LIBRIUM) capsule 25 mg  25 mg Oral Daily Kerry Hough, PA-C      . hydrOXYzine (ATARAX/VISTARIL) tablet 25 mg  25 mg Oral Q6H PRN Kerry Hough, PA-C      . loperamide (IMODIUM) capsule 2-4 mg  2-4 mg Oral PRN Kerry Hough, PA-C      . magnesium hydroxide (MILK OF MAGNESIA) suspension 30 mL  30 mL Oral Daily PRN Kerry Hough, PA-C      . multivitamin with minerals tablet 1 tablet  1 tablet Oral Daily Kerry Hough, PA-C   1 tablet at 10/30/12 0810  . ondansetron (ZOFRAN-ODT) disintegrating tablet 4 mg  4 mg Oral Q6H PRN Kerry Hough, PA-C      . thiamine (B-1) injection 100 mg  100 mg Intramuscular Once [employer], PA-C      . [START ON  10/31/2012] thiamine (VITAMIN B-1) tablet 100 mg  100 mg Oral Daily Kerry Hough, PA-C      . traZODone (DESYREL) tablet 50 mg  50 mg Oral QHS,MR X 1 Kerry Hough, PA-C        Observation Level/Precautions:  15 minute checks  Laboratory:  As per the ED  Psychotherapy:  Individual/group  Medications:  Librium detox/reassess co morbidities  Consultations:    Discharge Concerns:    Estimated LOS: 3-5 days  Other:     I certify that inpatient services furnished can reasonably be expected to improve the patient's condition.   Aamilah Augenstein A 8/6/201410:14 AM

## 2012-10-30 NOTE — BHH Group Notes (Signed)
[facility] LCSW Group Therapy  10/30/2012 2:28 PM  Type of Therapy:  Group Therapy  Participation Level:  Minimal  Participation Quality:  Attentive  Affect:  Blunted  Cognitive:  Oriented  Insight:  Limited  Engagement in Therapy:  Limited  Modes of Intervention:  Discussion, Education, Exploration, Socialization and Support  Summary of Progress/Problems: Emotion Regulation: This group focused on both positive and negative emotion identification and allowed group members to process ways to identify feelings, regulate negative emotions, and find healthy ways to manage internal/external emotions. Group members were asked to reflect on a time when their reaction to an emotion led to a negative outcome and explored how alternative responses using emotion regulation would have benefited them. Group members were also asked to discuss a time when emotion regulation was utilized when a negative emotion was experienced. Harlon came to group about 30 minutes late. He minimally participated but was open to sharing when asked to share by CSW. Nello stated that he found that "staying busy, going for a walk, and staying away from large groups of people" helps him to maintain mental clarity and regulate negative emotions. Normon stated that he drank to suppress negative emotions in the past and plans to apply the skills the he has learned over time back into his life in place of drinking. "I'm going to do this by going to [facility] for longer treatment and to get more sober time under my belt."    Smart, Rithika Seel 10/30/2012, 2:28 PM

## 2012-10-30 NOTE — BHH Suicide Risk Assessment (Signed)
Suicide Risk Assessment  Admission Assessment     Nursing information obtained from:  Patient Demographic factors:  Male;Low socioeconomic status;Unemployed Current Mental Status:  NA Loss Factors:  Decrease in vocational status;Financial problems / change in socioeconomic status Historical Factors:  Family history of mental illness or substance abuse Risk Reduction Factors:  Living with another person, especially a relative;Positive therapeutic relationship  CLINICAL FACTORS:   Depression:   Comorbid alcohol abuse/dependence Alcohol/Substance Abuse/Dependencies  COGNITIVE FEATURES THAT CONTRIBUTE TO RISK:  Closed-mindedness Polarized thinking Thought constriction (tunnel vision)    SUICIDE RISK:   Moderate:  Frequent suicidal ideation with limited intensity, and duration, some specificity in terms of plans, no associated intent, good self-control, limited dysphoria/symptomatology, some risk factors present, and identifiable protective factors, including available and accessible social support.  PLAN OF CARE: Supportive approach/coping skills/relapse prevention                               Detox                               Reassess and address the co morbidities  I certify that inpatient services furnished can reasonably be expected to improve the patient's condition.  Liya Strollo A 10/30/2012, 2:13 PM

## 2012-10-31 NOTE — [facility] Group Notes (Signed)
[facility] LCSW Group Therapy  10/31/2012 2:54 PM  Type of Therapy:  Group Therapy  Participation Level:  Active  Participation Quality:  Appropriate  Affect:  Appropriate  Cognitive:  Alert  Insight:  Limited  Engagement in Therapy:  Improving  Modes of Intervention:  Discussion, Education, Exploration, Socialization and Support  Summary of Progress/Problems:  Finding Balance in Life. Today's group focused on defining balance in one's own words, identifying things that can knock one off balance, and exploring healthy ways to maintain balance in life. Group members were asked to provide an example of a time when they felt off balance, describe how they handled that situation,and process healthier ways to regain balance in the future. Group members were asked to share the most important tool for maintaining balance that they learned while at [facility] and how they plan to apply this method after discharge. Jonathon Hunt was engaged and attentive throughout group. He participated in group conversation and shows improvement in the group setting. Jonathon Hunt demonstrates limited insight regarding his ability to identify what throws him off balance--bad influences, "paryting too hard", and identifying ways to reestablish/maintain balance in life. Jonathon Hunt stated that "I am a good alcoholic. I don't lose patience or get angry. I just go with the flow and have fun." When confronted about what he could replace drinking alcohol with as a source of balance, he stated that getting a good nights sleep was "all I need."     .Smart, Moosa Bueche 10/31/2012, 2:54 PM

## 2012-10-31 NOTE — Progress Notes (Signed)
[facility] MD Progress Note  10/31/2012 3:41 PM Jonathon Hunt  MRN:  [id number] Subjective:  Jonathon Hunt states that he is wanting to pursue rehab after the detox. He is still having some GI symptoms specially in the morning. Some nausea. He was able to eat some breakfast this morning, not a lot. Still with some weakness, some shakiness. He Would ideally like to go from here to [facility] Diagnosis:  Alcohol Dependence, Depressive Disorder NOS  ADL's:  Intact  Sleep: Fair  Appetite:  Poor  Suicidal Ideation:  Plan:  denies Intent:  denies Means:  denies Homicidal Ideation:  Plan:  denies Intent:  denies Means:  denies AEB (as evidenced by):  Psychiatric Specialty Exam: Review of Systems  HENT: Negative.   Eyes: Negative.   Respiratory: Negative.   Cardiovascular: Negative.   Gastrointestinal: Positive for heartburn.       Nausea in the morning  Genitourinary: Negative.   Musculoskeletal: Negative.   Skin: Negative.   Neurological: Positive for weakness.  Endo/Heme/Allergies: Negative.   Psychiatric/Behavioral: Positive for depression and substance abuse. The patient is nervous/anxious.     Blood pressure 124/88, pulse 114, temperature 98.3 F (36.8 C), temperature source Oral, resp. rate 18.There is no weight on file to calculate BMI.  General Appearance: Disheveled  Eye Solicitor::  Fair  Speech:  Clear and Coherent and Slow  Volume:  Decreased  Mood:  Anxious, Depressed and worried  Affect:  Restricted  Thought Process:  Coherent and Goal Directed  Orientation:  Full (Time, Place, and Person)  Thought Content:  worries, concerns, symptoms  Suicidal Thoughts:  No  Homicidal Thoughts:  No  Memory:  Immediate;   Fair Recent;   Fair Remote;   Fair  Judgement:  Fair  Insight:  superficial  Psychomotor Activity:  Restlessness  Concentration:  Fair  Recall:  Fair  Akathisia:  No  Handed:  Right  AIMS (if indicated):     Assets:  Desire for Improvement  Sleep:  Number of Hours:  6.5   Current Medications: Current Facility-Administered Medications  Medication Dose Route Frequency Provider Last Rate Last Dose  . acetaminophen (TYLENOL) tablet 650 mg  650 mg Oral Q6H PRN Kerry Hough, PA-C      . alum & mag hydroxide-simeth (MAALOX/MYLANTA) 200-200-20 MG/5ML suspension 30 mL  30 mL Oral Q4H PRN Kerry Hough, PA-C      . chlordiazePOXIDE (LIBRIUM) capsule 25 mg  25 mg Oral Q6H PRN Kerry Hough, PA-C      . chlordiazePOXIDE (LIBRIUM) capsule 25 mg  25 mg Oral TID Kerry Hough, PA-C   25 mg at 10/31/12 1157   Followed by  . [START ON 11/01/2012] chlordiazePOXIDE (LIBRIUM) capsule 25 mg  25 mg Oral BH-qamhs Spencer E Simon, PA-C       Followed by  . [START ON 11/02/2012] chlordiazePOXIDE (LIBRIUM) capsule 25 mg  25 mg Oral Daily Kerry Hough, PA-C      . hydrOXYzine (ATARAX/VISTARIL) tablet 25 mg  25 mg Oral Q6H PRN Kerry Hough, PA-C      . loperamide (IMODIUM) capsule 2-4 mg  2-4 mg Oral PRN Kerry Hough, PA-C      . magnesium hydroxide (MILK OF MAGNESIA) suspension 30 mL  30 mL Oral Daily PRN Kerry Hough, PA-C      . multivitamin with minerals tablet 1 tablet  1 tablet Oral Daily Kerry Hough, PA-C   1 tablet at 10/31/12 0743  . nicotine (  NICODERM CQ - dosed in mg/24 hours) patch 14 mg  14 mg Transdermal Daily Rachael Fee, MD   14 mg at 10/31/12 0743  . ondansetron (ZOFRAN-ODT) disintegrating tablet 4 mg  4 mg Oral Q6H PRN Kerry Hough, PA-C   4 mg at 10/30/12 1718  . thiamine (B-1) injection 100 mg  100 mg Intramuscular Once [employer], PA-C      . thiamine (VITAMIN B-1) tablet 100 mg  100 mg Oral Daily Kerry Hough, PA-C   100 mg at 10/31/12 0743  . traZODone (DESYREL) tablet 50 mg  50 mg Oral QHS,MR X 1 Kerry Hough, PA-C   50 mg at 10/30/12 2136    Lab Results: No results found for this or any previous visit (from the past 48 hour(s)).  Physical Findings: AIMS: Facial and Oral Movements Muscles of Facial Expression: None,  normal Lips and Perioral Area: None, normal Jaw: None, normal Tongue: None, normal,Extremity Movements Upper (arms, wrists, hands, fingers): None, normal Lower (legs, knees, ankles, toes): None, normal, Trunk Movements Neck, shoulders, hips: None, normal, Overall Severity Severity of abnormal movements (highest score from questions above): None, normal Incapacitation due to abnormal movements: None, normal Patient's awareness of abnormal movements (rate only patient's report): No Awareness, Dental Status Current problems with teeth and/or dentures?: No Does patient usually wear dentures?: No  CIWA:  CIWA-Ar Total: 3 COWS:  COWS Total Score: 2  Treatment Plan Summary: Daily contact with patient to assess and evaluate symptoms and progress in treatment Medication management  Plan: Supportive approach/coping skills/relapse prevention           Continue detox protocol           Reassess the comorbidities  Medical Decision Making Problem Points:  Review of psycho-social stressors (1) Data Points:  Review of medication regiment & side effects (2)  I certify that inpatient services furnished can reasonably be expected to improve the patient's condition.   Charene Mccallister A 10/31/2012, 3:41 PM

## 2012-10-31 NOTE — Progress Notes (Signed)
Patient did attend the evening karaoke group. Pt was attentive and supportive.   

## 2012-10-31 NOTE — [facility] Suicide Risk Assessment (Signed)
[facility] INPATIENT: Family/Significant Other Suicide Prevention Education  Suicide Prevention Education:  Education Completed; No one has been identified by the patient as the family member/significant other with whom the patient will be residing, and identified as the person(s) who will aid the patient in the event of a mental health crisis (suicidal ideations/suicide attempt).   Pt did not c/o SI at admission, nor have they endorsed SI during their stay here. SPE not required. Pt provided with SPI pamphlet and encouraged to ask questions/talk about concerns relating to SPE.   [employer], LCSWA 10/31/2012 4:31 PM

## 2012-10-31 NOTE — Progress Notes (Signed)
Pt presents with mild tremors rated at a 2 on the CIWA scale. Pt denies any other detox symptoms. Pt is currently denying any anxiety, depression, or SI/HI/AVH. Pt is pleasant 47 yr old that interacts appropriately within the milieu. Pt attended [employer]. Pt express no concerns he wishes for this writer to address at this time.  A: Writer administered scheduled medications to pt. Continued support and availability as needed was extended to this pt. Staff continue to monitor pt with q47min checks.  R: No adverse drug reactions noted. Pt receptive to treatment. Pt remains safe at this time.

## 2012-10-31 NOTE — Progress Notes (Signed)
D:  Jonathon Hunt reports that he slept ok and his appetite is improving.  He rates hopelessness and depression at 5/6 of 10.  He appears flat and depressed, but is interacting appropriately with other.  He complains of tremors with withdrawal, but states that this is getting better.  He denies SI/HI/AVH at this time.   A:  Medications administered as ordered.  Emotional support provided.  Safety checks q 15 minutes. R:  Safety maintained on unit.

## 2012-10-31 NOTE — Progress Notes (Signed)
Recreation Therapy Notes  Date: 08.07.2014 Time: 3:00pm Location: [address] Dayroom  Group Topic: Communication, Team Building, Problem Solving  Goal Area(s) Addresses:  Patient will work effectively in teams to accomplish shared goal. Patient will identify skills used to make activity successful. Patient will relate skills used in group session to positive lifestyle changes.  Behavioral Response: Appropriate, Engaged  Intervention: Team Building Activity  Activity: Stage manager. Patients were divided into groups of three. Patients were given 12 drinking straws and a length of masking tape. Using supplies provided patients were asked to build a freestanding structure to catch a set of keys dropped from approximately 4 feet.    Education: Customer service manager, Discharge Planning   Education Outcome: Acknowledges understanding  Clinical Observations/Feedback: Patient actively participated in group session. Patient made no contributions to opening discussion, but was observed to actively listen to conversation, as he made appropriate eye contact with speaker. Patient actively engaged in activity, offering suggestions and assisting team members with building landing pad. Patient team successful at building a landing pad to capture keys. Patient identified team work as a necessary skills to make activity a success. Patient additionally actively listened to points raised by peers and LRT as he maintained appropriate eye contact with speaker and nodded head in agreement with points of interest.   Jearl Klinefelter, LRT/CTRS  Jearl Klinefelter 10/31/2012 9:02 PM

## 2012-11-01 DIAGNOSIS — F1994 Other psychoactive substance use, unspecified with psychoactive substance-induced mood disorder: Secondary | ICD-10-CM

## 2012-11-01 DIAGNOSIS — F1020 Alcohol dependence, uncomplicated: Principal | ICD-10-CM

## 2012-11-01 MED ORDER — TRAZODONE HCL 50 MG PO TABS: 50.0000 mg | ORAL_TABLET | Freq: Every evening | ORAL | Status: DC | PRN

## 2012-11-01 NOTE — Progress Notes (Signed)
Patient ID: Jonathon Hunt, male   DOB: [birth year], 47 y.o.   MRN: [id number] Patient discharged per physician order; patient denies SI/HI and A/V hallucinations; patient received samples; prescriptions, and copy of AVS after it was reviewed; patient verbalized and signed that he received all his belongings; patient left the unit ambulatory

## 2012-11-01 NOTE — Progress Notes (Signed)
[facility] Adult Case Management Discharge Plan :  Will you be returning to the same living situation after discharge: Yes,  home with sister until admission at [facility] on Wed. At discharge, do you have transportation home?:Yes,  bus pass Do you have the ability to pay for your medications:Yes,  mental health  Release of information consent forms completed and in the chart;  Patient's signature needed at discharge.  Patient to Follow up at: Follow-up Information   Follow up with [facility] On 11/06/2012. (Arrive by 8AM with ID, 30 day medication supply, and clothing. )    Contact information:   [address] [place], [place] [address] phone: [phone] fax: [phone]      Follow up with [facility] (Walk in Monday through Friday between 8AM-9AM for hospital followup/medication management.)    Contact information:   [address], [place] [address] phone: [phone] fax: [phone]      Patient denies SI/HI:   Yes,  admission/self report    Safety Planning and Suicide Prevention discussed:  Yes,  SPE not required for pt as he did not endorse SI during admission or during stay.Pt given SPI pamphlet and encouraged to ask questions and talk about concerns relating to SPE.  Smart, Jeanette Moffatt 11/01/2012, 11:13 AM

## 2012-11-01 NOTE — Progress Notes (Signed)
:   11/01/2012  Time: 10:00 AM  Group Topic/Focus:  Relapse Prevention Planning: The focus of this group is to define relapse and discuss the need for planning to combat relapse.  Participation Level: Active  Participation Quality: Appropriate, Sharing and Supportive  Affect: Appropriate  Cognitive: Appropriate  Insight: Appropriate  Engagement in Group: Engaged and Supportive  Modes of Intervention: Discussion, Education and Support  Additional Comments: pt was appropriate in group.  Salman Wellen M  11/01/2012, 4:24 PM  

## 2012-11-01 NOTE — Progress Notes (Signed)
:   11/01/2012  Time: 11:00 AM  Group Topic/Focus:  Relapse Prevention Planning: The focus of this group is to define relapse and discuss the need for planning to combat relapse.  Participation Level: Active  Participation Quality: Appropriate, Sharing and Supportive  Affect: Appropriate  Cognitive: Appropriate  Insight: Appropriate  Engagement in Group: Engaged and Supportive  Modes of Intervention: Discussion, Education and Support  Additional Comments: pt was appropriate in group.  Stashia Sia M  11/01/2012, 4:24 PM  

## 2012-11-01 NOTE — [facility] Group Notes (Signed)
[facility] LCSW Aftercare Discharge Planning Group Note   11/01/2012 9:34 AM  Participation Quality:  Appropriate   Mood/Affect:  Appropriate  Depression Rating:  5  Anxiety Rating:  5  Thoughts of Suicide:  No Will you contract for safety?   NA  Current AVH:  No  Plan for Discharge/Comments:  Pt has [facility] date of wed 813 and plans to followup at [facility] for med management. Pt plans to stay with his sister if he is d/ced prior to going to [facility]. He reports no withdrawal symptoms today.   Transportation Means: bus pass  Supports: sister/limited family supports.   Smart, [employer]

## 2012-11-01 NOTE — Discharge Summary (Signed)
Physician Discharge Summary Note  Patient:  Jonathon Hunt is an 47 y.o., male MRN:  [id number] DOB:  [birth year] Patient phone:  [phone] (home)  Patient address:   [address] [place] [address],   Date of Admission:  10/30/2012  Date of Discharge: 11/01/12  Reason for Admission:  Alcohol detox  Discharge Diagnoses: Active Problems:   Alcohol dependence   Depressive disorder, not elsewhere classified   Substance induced mood disorder  Review of Systems  Constitutional: Negative.   HENT: Negative.   Eyes: Negative.   Respiratory: Negative.   Cardiovascular: Negative.   Gastrointestinal: Negative.   Genitourinary: Negative.   Musculoskeletal: Negative.   Skin: Negative.   Neurological: Negative.   Endo/Heme/Allergies: Negative.   Psychiatric/Behavioral: Positive for substance abuse (Alcohol dependence). Negative for depression, suicidal ideas, hallucinations and memory loss. The patient is nervous/anxious (Stabilized with medication prior to discharge) and has insomnia (Stabilized with medication prior to discharge).    Axis Diagnosis:   AXIS I:  Substance Induced Mood Disorder and Alcohol dependence AXIS II:  Deferred AXIS III:   Past Medical History  Diagnosis Date  . Hypertension   . Pneumonia   . EtOH dependence   . H/O ETOH abuse    AXIS IV:  Substance abuse issues AXIS V:  63  Level of Care:  OP  Hospital Course: States he got tired of drinking. "drinks like crazy." Drinks a fith or a six pack or combines them. Drinks every day. Has been drinking for 25-30 years. He experiences withdrawal when he does not drink. Alcohol has kept him from jobs and other activities. His longest period of sobriety has been few days. He went to a program for 3 days 20 years ago. He did not pursue any further work on his recovery. His wife died of renal failure several years ago. It is still hard. He has had couple of relationships but nothing serious. He does not  have a stable place to live right now. He wants to abstain. He admits to depression and anxiety.   After admission assessment and evaluation and based on the toxicology reports, it was determined that Jonathon Hunt will need detoxification treatment protocol to stabilize his systems of alcohol intoxication and to combat the withdrawal symptoms of alcohol as well. And his discharge plans included a referral to a long term treatment facility [facility]) for a more intense substance abuse treatment. Jonathon Hunt was then started on Librium protocol for his alcohol detoxification. He was also enrolled in group counseling sessions and activities where he was taught, counseled and learned coping skills that should help him after discharge to cope better, manage his substance abuse problems to maintain a much longer sobriety.   Besides the detoxification protocol, patient also received Gabapentin 100 mg for anxiety, Trazodone 50  mg Q bedtime for sleep. He was also was enrolled and attended AA/NA meetings being offered and held on this unit. He presented no previous and or identifiable medical conditions that required treatment and or monitoring. He was monitored closely for any potential problems that may arise as a result of and or during detoxification treatment. Patient tolerated his treatment regimen and detoxification treatment protocol without any significant adverse effects and or reactions reported.  Patient attended treatment team meeting this am and met with the treatment team members. His reason for admission, present symptoms, substance abuse issues, response to treatment and discharge plans discussed. Patient endorsed that he is doing well and stable for discharge  to pursue the next phase of his substance abuse treatment. It was agreed upon that he will continue substance abuse treatment at the [facility] in [place], [place] on 11/06/12. He is instructed to report at the lobby on the morning  of 06/20/12 at 08:00 am. And for medication management and routine psychiatric care, Jonathon Hunt will be seen at the [facility] clinic here in [place], [place]. He has been informed and instructed that this a walk-in appointment between the hours of 08:00 - 09:00 am, Monday thru Friday. The addresses, dates, times and contact information for [facility] residential provided for patient in writing. And for the mean time, he is being discharged to his home with his sister untill 11/06/12.  In addition to residential substance abuse treatment, Jonathon Hunt is encouraged to join/attend AA/NA meetings being offered and held within his community. He is instructed and encouraged to get a trusted sponsor from the advise of others or from whomever within the AA meetings seems to make sense, and who has a proven track record, and will hold him responsible for his sobriety, and both expects and insists on his total  abstinence from alcohol. Upon discharge, patient adamantly denies suicidal, homicidal ideations, auditory, visual hallucinations, delusional thinking and or withdrawal symptoms. Patient left [facility] with all personal belongings in no apparent distress. He received 2 weeks worth supply samples of his [facility] discharge medications. Transportation per bus, and bus fare/voucher provided by [facility].   Consults:  psychiatry  Significant Diagnostic Studies:  labs: CBC with diff, CMP, UDS, Toxicolgy tests, U/A  Discharge Vitals:   Blood pressure 112/77, pulse 108, temperature 97.2 F (36.2 C), temperature source Oral, resp. rate 15. There is no weight on file to calculate BMI. Lab Results:   Results for orders placed during the hospital encounter of 10/29/12 (from the past 72 hour(s))  ACETAMINOPHEN LEVEL     Status: None   Collection Time    10/29/12 11:48 AM      Result Value Range   Acetaminophen (Tylenol), Serum <15.0  10 - 30 ug/mL   Comment:            THERAPEUTIC CONCENTRATIONS VARY     SIGNIFICANTLY. A RANGE OF 10-30      ug/mL MAY BE AN EFFECTIVE     CONCENTRATION FOR MANY PATIENTS.     HOWEVER, SOME ARE BEST TREATED     AT CONCENTRATIONS OUTSIDE THIS     RANGE.     ACETAMINOPHEN CONCENTRATIONS     >150 ug/mL AT 4 HOURS AFTER     INGESTION AND >50 ug/mL AT 12     HOURS AFTER INGESTION ARE     OFTEN ASSOCIATED WITH TOXIC     REACTIONS.  CBC     Status: None   Collection Time    10/29/12 11:48 AM      Result Value Range   WBC 9.2  4.0 - 10.5 K/uL   RBC 5.10  4.22 - 5.81 MIL/uL   Hemoglobin 15.9  13.0 - 17.0 g/dL   HCT 16.1  09.6 - 04.5 %   MCV 87.1  78.0 - 100.0 fL   MCH 31.2  26.0 - 34.0 pg   MCHC 35.8  30.0 - 36.0 g/dL   RDW 40.9  81.1 - 91.4 %   Platelets 198  150 - 400 K/uL  COMPREHENSIVE METABOLIC PANEL     Status: Abnormal   Collection Time    10/29/12 11:48 AM      Result Value  Range   Sodium 134 (*) 135 - 145 mEq/L   Potassium 3.9  3.5 - 5.1 mEq/L   Chloride 97  96 - 112 mEq/L   CO2 24  19 - 32 mEq/L   Glucose, Bld 103 (*) 70 - 99 mg/dL   BUN 5 (*) 6 - 23 mg/dL   Creatinine, Ser 9.60  0.50 - 1.35 mg/dL   Calcium 9.5  8.4 - 45.4 mg/dL   Total Protein 7.6  6.0 - 8.3 g/dL   Albumin 3.8  3.5 - 5.2 g/dL   AST 29  0 - 37 U/L   ALT 16  0 - 53 U/L   Alkaline Phosphatase 61  39 - 117 U/L   Total Bilirubin 0.4  0.3 - 1.2 mg/dL   GFR calc non Af Amer >90  >90 mL/min   GFR calc Af Amer >90  >90 mL/min   Comment:            The eGFR has been calculated     using the CKD EPI equation.     This calculation has not been     validated in all clinical     situations.     eGFR's persistently     <90 mL/min signify     possible Chronic Kidney Disease.  ETHANOL     Status: Abnormal   Collection Time    10/29/12 11:48 AM      Result Value Range   Alcohol, Ethyl (B) 167 (*) 0 - 11 mg/dL   Comment:            LOWEST DETECTABLE LIMIT FOR     SERUM ALCOHOL IS 11 mg/dL     FOR MEDICAL PURPOSES ONLY  SALICYLATE LEVEL     Status: Abnormal   Collection Time    10/29/12 11:48 AM      Result  Value Range   Salicylate Lvl <2.0 (*) 2.8 - 20.0 mg/dL  URINE RAPID DRUG SCREEN (HOSP PERFORMED)     Status: None   Collection Time    10/29/12 12:47 PM      Result Value Range   Opiates NONE DETECTED  NONE DETECTED   Cocaine NONE DETECTED  NONE DETECTED   Benzodiazepines NONE DETECTED  NONE DETECTED   Amphetamines NONE DETECTED  NONE DETECTED   Tetrahydrocannabinol NONE DETECTED  NONE DETECTED   Barbiturates NONE DETECTED  NONE DETECTED   Comment:            DRUG SCREEN FOR MEDICAL PURPOSES     ONLY.  IF CONFIRMATION IS NEEDED     FOR ANY PURPOSE, NOTIFY LAB     WITHIN 5 DAYS.                LOWEST DETECTABLE LIMITS     FOR URINE DRUG SCREEN     Drug Class       Cutoff (ng/mL)     Amphetamine      1000     Barbiturate      200     Benzodiazepine   200     Tricyclics       300     Opiates          300     Cocaine          300     THC              50    Physical Findings: AIMS: Facial and Oral Movements Muscles  of Facial Expression: None, normal Lips and Perioral Area: None, normal Jaw: None, normal Tongue: None, normal,Extremity Movements Upper (arms, wrists, hands, fingers): None, normal Lower (legs, knees, ankles, toes): None, normal, Trunk Movements Neck, shoulders, hips: None, normal, Overall Severity Severity of abnormal movements (highest score from questions above): None, normal Incapacitation due to abnormal movements: None, normal Patient's awareness of abnormal movements (rate only patient's report): No Awareness, Dental Status Current problems with teeth and/or dentures?: No Does patient usually wear dentures?: No  CIWA:  CIWA-Ar Total: 2 COWS:  COWS Total Score: 2  Psychiatric Specialty Exam: See Psychiatric Specialty Exam and Suicide Risk Assessment completed by Attending Physician prior to discharge.  Discharge destination:  Home  Is patient on multiple antipsychotic therapies at discharge:  No   Has Patient had three or more failed trials of  antipsychotic monotherapy by history:  No  Recommended Plan for Multiple Antipsychotic Therapies: NA     Medication List    STOP taking these medications       diphenhydrAMINE 25 MG tablet  Commonly known as:  BENADRYL     GOODYS EXTRA STRENGTH 520-260-32.5 MG Pack  Generic drug:  Aspirin-Acetaminophen-Caffeine     tetrahydrozoline 0.05 % ophthalmic solution      TAKE these medications     Indication   traZODone 50 MG tablet  Commonly known as:  DESYREL  Take 1 tablet (50 mg total) by mouth at bedtime and may repeat dose one time if needed. For sleep   Indication:  Trouble Sleeping       Follow-up Information   Follow up with [facility] On 11/06/2012. (Arrive by 8AM with ID, 30 day medication supply, and clothing. )    Contact information:   [address] [place], [place] [address] phone: [phone] fax: [phone]      Follow up with [facility]. (Walk in Monday through Friday between 8AM-9AM for hospital followup/medication management.)    Contact information:   [address], [place] [address] phone: [phone] fax: [phone]     Follow-up recommendations:  Activity:  As tolerated Diet: As recommended by your primary care doctor. Keep all scheduled follow-up appointments as recommended. Continue to work the relapse prevention plan Comments: Take all your medications as prescribed by your mental healthcare provider. Report any adverse effects and or reactions from your medicines to your outpatient provider promptly. Patient is instructed and cautioned to not engage in alcohol and or illegal drug use while on prescription medicines. In the event of worsening symptoms, patient is instructed to call the crisis hotline, 911 and or go to the nearest ED for appropriate evaluation and treatment of symptoms. Follow-up with your primary care provider for your other medical issues, concerns and or health care needs.    Total Discharge Time:  Greater  than 30 minutes.  Signed: Sanjuana Kava, PMHNP-BC, FNP 11/01/2012, 11:14 AM Agree with assessment and plan Reymundo Poll. Dub Mikes, M.D.

## 2012-11-01 NOTE — [facility] Suicide Risk Assessment (Signed)
Suicide Risk Assessment  Discharge Assessment     Demographic Factors:  Male  Mental Status Per Nursing Assessment::   On Admission:  NA  Current Mental Status by Physician: In full contact with reality. There are no suicidal ideas, plans or intent. His mood is euthymic, his affect is appropriate. He is exhibiting no acute withdrawal. He has a bed at [facility] on Wednesday. He is going to stay with his sister while waiting to go in. States he is going to keep himself safe while waiting for the bed. He is committed to abstinence   Loss Factors: NA  Historical Factors: NA  Risk Reduction Factors:   Positive social support  Continued Clinical Symptoms:  Depression:   Comorbid alcohol abuse/dependence Alcohol/Substance Abuse/Dependencies  Cognitive Features That Contribute To Risk: None identifed   Suicide Risk:  Minimal: No identifiable suicidal ideation.  Patients presenting with no risk factors but with morbid ruminations; may be classified as minimal risk based on the severity of the depressive symptoms  Discharge Diagnoses:   AXIS I:  Alcohol Dependence ,S/P alcohol withdrawal, Substance Induced Mood Disorder AXIS II:  Deferred AXIS III:   Past Medical History  Diagnosis Date  . Hypertension   . Pneumonia   . EtOH dependence   . H/O ETOH abuse    AXIS IV:  other psychosocial or environmental problems AXIS V:  61-70 mild symptoms  Plan Of Care/Follow-up recommendations:  Activity:  as tolerated Diet:  regular Follow up at [facility] on Wend[facility] Is patient on multiple antipsychotic therapies at discharge:  No   Has Patient had three or more failed trials of antipsychotic monotherapy by history:  No  Recommended Plan for Multiple Antipsychotic Therapies: N/A   Jonathon Hunt A 11/01/2012, 11:32 AM

## 2012-11-05 NOTE — Consult Note (Signed)
Agree with assessment and plan  Derhonda Eastlick A. Careli Luzader. M.D. 

## 2012-11-06 NOTE — Progress Notes (Signed)
Patient Discharge Instructions:  After Visit Summary (AVS):   Faxed to:  11/06/12 Discharge Summary Note:   Faxed to:  11/06/12 Psychiatric Admission Assessment Note:   Faxed to:  11/06/12 Suicide Risk Assessment - Discharge Assessment:   Faxed to:  11/06/12 Faxed/Sent to the Next Level Care provider:  11/06/12 Faxed to [facility] @ [phone] Faxed to [facility] @ [phone]  Jerelene Redden, 11/06/2012, 12:32 PM

## 2012-11-25 ENCOUNTER — Encounter (HOSPITAL_BASED_OUTPATIENT_CLINIC_OR_DEPARTMENT_OTHER): Payer: Self-pay | Admitting: *Deleted

## 2012-11-25 ENCOUNTER — Emergency Department (HOSPITAL_BASED_OUTPATIENT_CLINIC_OR_DEPARTMENT_OTHER)
Admission: EM | Admit: 2012-11-25 | Discharge: 2012-11-25 | Disposition: A | Discharge status: Home / Self Care | Payer: Self-pay | Attending: Emergency Medicine | Admitting: Emergency Medicine

## 2012-11-25 DIAGNOSIS — I10 Essential (primary) hypertension: Secondary | ICD-10-CM | POA: Insufficient documentation

## 2012-11-25 DIAGNOSIS — F17200 Nicotine dependence, unspecified, uncomplicated: Secondary | ICD-10-CM | POA: Insufficient documentation

## 2012-11-25 DIAGNOSIS — Z8701 Personal history of pneumonia (recurrent): Secondary | ICD-10-CM | POA: Insufficient documentation

## 2012-11-25 DIAGNOSIS — J309 Allergic rhinitis, unspecified: Secondary | ICD-10-CM | POA: Insufficient documentation

## 2012-11-25 DIAGNOSIS — Z79899 Other long term (current) drug therapy: Secondary | ICD-10-CM | POA: Insufficient documentation

## 2012-11-25 DIAGNOSIS — Z9889 Other specified postprocedural states: Secondary | ICD-10-CM | POA: Insufficient documentation

## 2012-11-25 MED ORDER — FLUTICASONE PROPIONATE 50 MCG/ACT NA SUSP: 2.0000 | Freq: Every day | NASAL | Status: DC

## 2012-11-25 NOTE — ED Provider Notes (Signed)
CSN: [id number]     Arrival date & time 11/25/12  1521 History   First MD Initiated Contact with Patient 11/25/12 1533     Chief Complaint  Patient presents with  . Sinusitis   (Consider location/radiation/quality/duration/timing/severity/associated sxs/prior Treatment) HPI Comments: Pt states that he is having bilateral sinus pressure for the last month:pt states that he usually uses nasal spray , but they won't let him use it at [facility] without a prescription:pt denies fever:pt states that benadryl has given some relief:denies cough  The history is provided by the patient. No language interpreter was used.    Past Medical History  Diagnosis Date  . Hypertension   . Pneumonia   . EtOH dependence   . H/O ETOH abuse    Past Surgical History  Procedure Laterality Date  . Lung surgery  2011    put a tube in to drain fluid out of lungs from pneumonia   Family History  Problem Relation Age of Onset  . Stroke Mother   . Lung cancer Father    History  Substance Use Topics  . Smoking status: Current Every Day Smoker -- 0.25 packs/day for 30 years    Types: Cigarettes  . Smokeless tobacco: Not on file  . Alcohol Use: 0.0 oz/week     Comment: one case per day    Review of Systems  Constitutional: Negative.   Respiratory: Negative.   Cardiovascular: Negative.     Allergies  Pine  Home Medications   Current Outpatient Rx  Name  Route  Sig  Dispense  Refill  . fluticasone (FLONASE) 50 MCG/ACT nasal spray   Nasal   Place 2 sprays into the nose daily.   16 g   2   . traZODone (DESYREL) 50 MG tablet   Oral   Take 1 tablet (50 mg total) by mouth at bedtime and may repeat dose one time if needed. For sleep   60 tablet   0    BP 127/90  Pulse 98  Temp(Src) 98.4 F (36.9 C) (Oral)  Resp 20  Wt 170 lb (77.111 kg)  BMI 24.39 kg/m2  SpO2 98% Physical Exam  Nursing note and vitals reviewed. Constitutional: He is oriented to person, place, and time. He appears  well-developed and well-nourished.  HENT:  Head: Normocephalic and atraumatic.  Right Ear: External ear normal.  Left Ear: External ear normal.  Nose: Mucosal edema present.  Cardiovascular: Normal rate and regular rhythm.   Pulmonary/Chest: Effort normal and breath sounds normal.  Musculoskeletal: Normal range of motion.  Neurological: He is alert and oriented to person, place, and time.  Skin: Skin is warm and dry.  Psychiatric: He has a normal mood and affect.    ED Course  Procedures (including critical care time) Labs Review Labs Reviewed - No data to display Imaging Review No results found.  MDM   1. Allergic rhinitis    Will give flonase:don't think any need for antibiotics    Teressa Lower, NP 11/25/12 1551

## 2012-11-25 NOTE — ED Provider Notes (Signed)
Medical screening examination/treatment/procedure(s) were performed by non-physician practitioner and as supervising physician I was immediately available for consultation/collaboration.    Vida Roller, MD 11/25/12 [phone]

## 2012-11-25 NOTE — ED Notes (Signed)
States he is having sinus pressure. Symptoms x 1 month. He is a resident of [facility]. They would not let him take his nasal spray.

## 2014-06-19 ENCOUNTER — Emergency Department (HOSPITAL_COMMUNITY)
Admission: EM | Admit: 2014-06-19 | Discharge: 2014-06-19 | Disposition: A | Discharge status: Home / Self Care | Payer: Self-pay | Attending: Emergency Medicine | Admitting: Emergency Medicine

## 2014-06-19 ENCOUNTER — Encounter (HOSPITAL_COMMUNITY): Payer: Self-pay

## 2014-06-19 DIAGNOSIS — Z8701 Personal history of pneumonia (recurrent): Secondary | ICD-10-CM | POA: Insufficient documentation

## 2014-06-19 DIAGNOSIS — Z7951 Long term (current) use of inhaled steroids: Secondary | ICD-10-CM | POA: Insufficient documentation

## 2014-06-19 DIAGNOSIS — K4090 Unilateral inguinal hernia, without obstruction or gangrene, not specified as recurrent: Secondary | ICD-10-CM | POA: Insufficient documentation

## 2014-06-19 DIAGNOSIS — I10 Essential (primary) hypertension: Secondary | ICD-10-CM | POA: Insufficient documentation

## 2014-06-19 DIAGNOSIS — Z720 Tobacco use: Secondary | ICD-10-CM | POA: Insufficient documentation

## 2014-06-19 NOTE — ED Provider Notes (Signed)
CSN: [id number]     Arrival date & time 06/19/14  0756 History   First MD Initiated Contact with Patient 06/19/14 0800     No chief complaint on file.    (Consider location/radiation/quality/duration/timing/severity/associated sxs/prior Treatment) Patient is a 49 y.o. male presenting with abdominal pain.  Abdominal Pain Pain location:  RLQ Pain quality: aching   Pain radiates to:  Does not radiate Pain severity:  Moderate Onset quality:  Gradual Duration: years. Timing:  Constant Progression:  Waxing and waning Chronicity:  Chronic Relieved by: laying down. Exacerbated by: standing up. Associated symptoms: no constipation, no cough, no diarrhea, no dysuria, no fever, no nausea, no shortness of breath and no vomiting     Past Medical History  Diagnosis Date  . Hypertension   . Pneumonia   . EtOH dependence   . H/O ETOH abuse    Past Surgical History  Procedure Laterality Date  . Lung surgery  2011    put a tube in to drain fluid out of lungs from pneumonia   Family History  Problem Relation Age of Onset  . Stroke Mother   . Lung cancer Father    History  Substance Use Topics  . Smoking status: Current Every Day Smoker -- 0.25 packs/day for 30 years    Types: Cigarettes  . Smokeless tobacco: Not on file  . Alcohol Use: 0.0 oz/week     Comment: one case per day    Review of Systems  Constitutional: Negative for fever.  Respiratory: Negative for cough and shortness of breath.   Gastrointestinal: Positive for abdominal pain. Negative for nausea, vomiting, diarrhea and constipation.  Genitourinary: Negative for dysuria.  All other systems reviewed and are negative.     Allergies  Pine  Home Medications   Prior to Admission medications   Medication Sig Start Date End Date Taking? Authorizing Provider  fluticasone (FLONASE) 50 MCG/ACT nasal spray Place 2 sprays into the nose daily. 11/25/12   Glendell Docker, NP  traZODone (DESYREL) 50 MG tablet Take 1  tablet (50 mg total) by mouth at bedtime and may repeat dose one time if needed. For sleep 11/01/12   Encarnacion Slates, NP   BP 131/88 mmHg  Pulse 73  Temp(Src) 98.1 F (36.7 C) (Oral)  Resp 17  SpO2 100% Physical Exam  Constitutional: He appears well-developed and well-nourished. No distress.  Alert, pleasant, well-hydrated, cooperative  HENT:  Head: Normocephalic and atraumatic.  Mouth/Throat: Oropharynx is clear and moist. No oropharyngeal exudate.  Eyes: EOM are normal. Pupils are equal, round, and reactive to light.  Cardiovascular: Normal rate, regular rhythm, normal heart sounds and intact distal pulses.  Exam reveals no gallop and no friction rub.   No murmur heard. Pulmonary/Chest: Effort normal and breath sounds normal. No respiratory distress. He has no wheezes. He has no rales.  Abdominal: Soft. He exhibits no distension and no mass. There is no tenderness. There is no rebound and no guarding.  Right direct inguinal hernia, easily reduced, no erythema or bruising, no incarceration  Genitourinary: Penis normal. Right testis shows no mass, no swelling and no tenderness. Left testis shows no mass, no swelling and no tenderness.  Normal GU exam  Skin: Skin is warm and dry. No rash noted. He is not diaphoretic.  Psychiatric: He has a normal mood and affect. His behavior is normal. Judgment and thought content normal.  Nursing note and vitals reviewed.   ED Course  Procedures (including critical care time) Labs Review Labs  Reviewed - No data to display  Imaging Review No results found.   EKG Interpretation None      MDM   Final diagnoses:  Direct inguinal hernia   49 year old male presents with right direct inguinal hernia. No incarceration on exam. Present for years and presents today hoping that he could get it taken care of because he had a day off work.  Afebrile and vital signs stable. No evidence of complication, incarceration, obstruction. Surgery follow-up  given.  Larence Penning, MD 06/19/14 4388  Blanchie Dessert, MD 06/22/14 1535

## 2014-06-19 NOTE — ED Notes (Signed)
Pt reports to ED for hernia pain.  Pt reports the hernia has been present for years and has been painful for "a while".  Pt reports he saw a PMD for hernia several months ago and was told to just keep an eye on it.  Pt denies any fever or n/v/d.

## 2014-06-19 NOTE — Discharge Instructions (Signed)
Hernia °A hernia happens when an organ inside your body pushes out through a weak spot in your belly (abdominal) wall. Most hernias get worse over time. They can often be pushed back into place (reduced). Surgery may be needed to repair hernias that cannot be pushed into place. °HOME CARE °· Keep doing normal activities. °· Avoid lifting more than 10 pounds (4.5 kilograms). °· Cough gently and avoid straining. Over time, these things will: °¨ Increase your hernia size. °¨ Irritate your hernia. °¨ Break down hernia repairs. °· Stop smoking. °· Do not wear anything tight over your hernia. Do not keep the hernia in with an outside bandage. °· Eat food that is high in fiber (fruit, vegetables, whole grains). °· Drink enough fluids to keep your pee (urine) clear or pale yellow. °· Take medicines to make your poop soft (stool softeners) if you cannot poop (constipated). °GET HELP RIGHT AWAY IF:  °· You have a fever. °· You have belly pain that gets worse. °· You feel sick to your stomach (nauseous) and throw up (vomit). °· Your skin starts to bulge out. °· Your hernia turns a different color, feels hard, or is tender. °· You have increased pain or puffiness (swelling) around the hernia. °· You poop more or less often. °· Your poop does not look the way normally does. °· You have watery poop (diarrhea). °· You cannot push the hernia back in place by applying gentle pressure while lying down. °MAKE SURE YOU:  °· Understand these instructions. °· Will watch your condition. °· Will get help right away if you are not doing well or get worse. °Document Released: 08/31/2009 Document Revised: 06/05/2011 Document Reviewed: 08/31/2009 °[employer]® Patient Information ©2015 [employer]. This information is not intended to replace advice given to you by your health care provider. Make sure you discuss any questions you have with your health care provider. ° °

## 2014-06-23 ENCOUNTER — Encounter (HOSPITAL_COMMUNITY): Payer: Self-pay | Admitting: Emergency Medicine

## 2014-06-23 ENCOUNTER — Emergency Department (HOSPITAL_COMMUNITY)
Admission: EM | Admit: 2014-06-23 | Discharge: 2014-06-23 | Disposition: A | Discharge status: Home / Self Care | Payer: Self-pay | Source: Home / Self Care | Attending: Family Medicine | Admitting: Family Medicine

## 2014-06-23 DIAGNOSIS — K4090 Unilateral inguinal hernia, without obstruction or gangrene, not specified as recurrent: Secondary | ICD-10-CM

## 2014-06-23 DIAGNOSIS — R1031 Right lower quadrant pain: Secondary | ICD-10-CM

## 2014-06-23 HISTORY — DX: Unilateral inguinal hernia, without obstruction or gangrene, not specified as recurrent: K40.90

## 2014-06-23 NOTE — ED Notes (Signed)
Reports history of hernia, diagnosed years ago, hernia went away, but has returned.

## 2014-06-23 NOTE — ED Provider Notes (Signed)
CSN: [id number]     Arrival date & time 06/23/14  0941 History   First MD Initiated Contact with Patient 06/23/14 0957     Chief Complaint  Patient presents with  . Hernia   (Consider location/radiation/quality/duration/timing/severity/associated sxs/prior Treatment) HPI  Pt presenting to have hernia fixed. Developed RLQ pain 5-6years ago and told he has a hernia. Pain initially resolved but returned 2 years ago. Now getting worse. Comes and goes but now described as being present most of the time. Worse w/ coughing or certain movements or straining. Daily soft BMs. Pain relieved by sitting still.   Seen in ED on 06/19/14 for same problem and told he needed to see the surgeon.  Past Medical History  Diagnosis Date  . Hypertension   . Pneumonia   . EtOH dependence   . H/O ETOH abuse   . Direct hernia    Past Surgical History  Procedure Laterality Date  . Lung surgery  2011    put a tube in to drain fluid out of lungs from pneumonia   Family History  Problem Relation Age of Onset  . Stroke Mother   . Lung cancer Father    History  Substance Use Topics  . Smoking status: Current Every Day Smoker -- 0.25 packs/day for 30 years    Types: Cigarettes  . Smokeless tobacco: Not on file  . Alcohol Use: 0.0 oz/week     Comment: one case per day    Review of Systems Per HPI with all other pertinent systems negative.   Allergies  Pine  Home Medications   Prior to Admission medications   Not on File   BP 129/90 mmHg  Pulse 75  Temp(Src) 98.1 F (36.7 C) (Oral)  Resp 16  SpO2 100% Physical Exam Physical Exam  Constitutional: oriented to person, place, and time. appears well-developed and well-nourished. No distress.  HENT:  Head: Normocephalic and atraumatic.  Eyes: EOMI. PERRL.  Neck: Normal range of motion.  Cardiovascular: RRR, no m/r/g, 2+ distal pulses,  Pulmonary/Chest: Effort normal and breath sounds normal. No respiratory distress.  Abdominal: large tennis  ball sized right direct hernia. Reducible. Nontender to palpation. Musculoskeletal: Normal range of motion. Non ttp, no effusion.  Neurological: alert and oriented to person, place, and time.  Skin: Skin is warm. No rash noted. non diaphoretic.  Psychiatric: normal mood and affect. behavior is normal. Judgment and thought content normal.   ED Course  Procedures (including critical care time) Labs Review Labs Reviewed - No data to display  Imaging Review No results found.   MDM   1. Direct hernia   2. RLQ abdominal pain    Reducible hernia. Patient spent significant time with our social worker in order to get approval for some kind of insurance coverage. Patient to follow-up with [place] [facility] for further evaluation. Discussed deep for emergent evaluation if hernia becomes incarcerated.  Precautions given and all questions answered  Linna Darner, MD Family Medicine 06/23/2014, 10:48 AM      Waldemar Dickens, MD 06/23/14 1048

## 2014-06-23 NOTE — ED Notes (Signed)
Patient  Had extensive conversation with maggie in regards to financial needs prior to discharge

## 2014-06-23 NOTE — Discharge Instructions (Signed)
Please follow up with the general surgery group to have your hernia operated on.  Please go to the emergency room if your hernia becomes protruded and will not go back and the pain gets worse.

## 2014-06-25 ENCOUNTER — Ambulatory Visit: Payer: Self-pay | Attending: Family Medicine | Admitting: Physician Assistant

## 2014-06-25 ENCOUNTER — Encounter: Payer: Self-pay | Admitting: Physician Assistant

## 2014-06-25 VITALS — BP 126/83 | HR 66 | Temp 98.0°F | Resp 16 | Ht 69.0 in | Wt 167.0 lb

## 2014-06-25 DIAGNOSIS — K4030 Unilateral inguinal hernia, with obstruction, without gangrene, not specified as recurrent: Secondary | ICD-10-CM

## 2014-06-25 NOTE — Progress Notes (Signed)
Jonathon Hunt  PPJ:[id number]  PYK:[id number]  DOB - [birth year]  Chief Complaint  Patient presents with  . Hernia       Subjective:   Jonathon Hunt is a 49 y.o. male here today for establishment of care. He has been to the emergency department and urgent care twice in the last week with complaints of right lower quadrant invite Gwendolyn pain. He was diagnosed with a direct hernia 5-6 years ago but never underwent surgery. Initially his symptoms got better. He's had a recurrence or flare of symptoms over the last couple of years but worse over the last couple of months. He's had intermittent pain. Is worse when he coughs. Is better when he sits still. He is still able to work but is in a lot of discomfort. The emergency department sent him here for a general surgery referral.    ROS: GEN: denies fever or chills, denies change in weight Skin: admits to "bad skin", acne LUNGS: denies SHOB, dyspnea, PND, orthopnea CV: denies CP or palpitations GU: Bulging right groin without evidence of incarceration ABD: + pain, N or V   Problem  Alcohol Dependence (Resolved)  THORACOTOMY, HX OF (Resolved)   Qualifier: Diagnosis of  By: Jobe Igo MD, David       ALLERGIES: Allergies  Allergen Reactions  . Pine Hives    PAST MEDICAL HISTORY: Past Medical History  Diagnosis Date  . Hypertension   . Pneumonia   . EtOH dependence   . H/O ETOH abuse   . Direct hernia     PAST SURGICAL HISTORY: Past Surgical History  Procedure Laterality Date  . Lung surgery  2011    put a tube in to drain fluid out of lungs from pneumonia    MEDICATIONS AT HOME: Prior to Admission medications   Not on File     Objective:   Filed Vitals:   06/25/14 1026  BP: 126/83  Pulse: 66  Temp: 98 F (36.7 C)  Resp: 16  Height: 5\' 9"  (1.753 m)  Weight: 167 lb (75.751 kg)  SpO2: 100%    Exam General appearance : Awake, alert, not in any distress. Speech Clear. Not toxic looking HEENT:  Atraumatic and Normocephalic, pupils equally reactive to light and accomodation Neck: supple, no JVD. No cervical lymphadenopathy.  Chest:Good air entry bilaterally, no added sounds  CVS: S1 S2 regular, no murmurs.  Abdomen: Bowel sounds present, Non tender and not distended with no gaurding, rigidity or rebound. Extremities: B/L Lower Ext shows no edema, both legs are warm to touch Neurology: Awake alert, and oriented X 3, CN II-XII intact, Non focal Skin:No Rash Wounds:N/A  Data Review No results found for: HGBA1C   Assessment & Plan  1. Right-sided direct inguinal hernia  -Referral to general surgery  -Referral to financial counselor 2. Hx of Hypertension-controlled  -Continue DASH diet   -Not currently on any medications 3. Smoker  -Cessation discussed    Development worker, community Return in about 1 month (around 07/25/2014).  The patient was given clear instructions to go to ER or return to medical center if symptoms don't improve, worsen or new problems develop. The patient verbalized understanding. The patient was told to call to get lab results if they haven't heard anything in the next week.   This note has been created with Surveyor, quantity. Any transcriptional errors are unintentional.    Zettie Pho, PA-C [facility] [place], [place]  06/25/2014, 10:46 AM

## 2014-06-25 NOTE — Progress Notes (Signed)
Patient has had right sided hernia for many years-would like referral to have it repaired

## 2014-07-27 ENCOUNTER — Ambulatory Visit: Payer: Self-pay | Admitting: Internal Medicine

## 2014-07-27 ENCOUNTER — Ambulatory Visit: Payer: Self-pay | Attending: Internal Medicine | Admitting: Internal Medicine

## 2014-07-27 ENCOUNTER — Encounter: Payer: Self-pay | Admitting: Internal Medicine

## 2014-07-27 VITALS — BP 126/82 | HR 80 | Temp 98.0°F | Resp 16 | Wt 162.2 lb

## 2014-07-27 DIAGNOSIS — K4021 Bilateral inguinal hernia, without obstruction or gangrene, recurrent: Secondary | ICD-10-CM | POA: Insufficient documentation

## 2014-07-27 DIAGNOSIS — F17219 Nicotine dependence, cigarettes, with unspecified nicotine-induced disorders: Secondary | ICD-10-CM | POA: Insufficient documentation

## 2014-07-27 LAB — POCT GLYCOSYLATED HEMOGLOBIN (HGB A1C): HEMOGLOBIN A1C: 5.5

## 2014-07-27 NOTE — Progress Notes (Signed)
Patient here to establish care Patient currently not taking any prescribed medications Complains of having a hernia to the right side of his groin that has  Gotten progressively worse

## 2014-07-27 NOTE — Progress Notes (Signed)
Patient ID: Jonathon Hunt, male   DOB: Feb 27, 1966, 49 y.o.   MRN: [id number]   Jonathon Hunt, is a 49 y.o. male  Jonathon Hunt  Jonathon Hunt  DOB - [birth year]  CC:  Chief Complaint  Patient presents with  . Establish Care       HPI: Jonathon Hunt is a 48 y.o. male here today to establish medical care. Patient has history of hypertension, recurrent bilateral inguinal hernia and ethanol abuse. Patient is not on any medication. He is complaining of right sided inguinal hernia getting bigger and progressively more painful because of its margin effect. Patient had been in the [facility] and urgent care twice in the last few weeks with complaints of pain around the hernia. He has had this hernia on the right repaired about 6 years ago but also came back. His pain is intermittent, no nausea or vomiting associated, no abdominal distention or constipation. 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 He is able to work but associated with discomfort. He needs referral to surgery.   Allergies  Allergen Reactions  . Pine Hives   Past Medical History  Diagnosis Date  . Hypertension   . Pneumonia   . EtOH dependence   . H/O ETOH abuse   . Direct hernia    No current outpatient prescriptions on file prior to visit.   No current facility-administered medications on file prior to visit.   Family History  Problem Relation Age of Onset  . Stroke Mother   . Lung cancer Father    History   Social History  . Marital Status: Widowed    Spouse Name: N/A  . Number of Children: N/A  . Years of Education: N/A   Occupational History  . Not on file.   Social History Main Topics  . Smoking status: Current Every Day Smoker -- 0.25 packs/day for 30 years    Types: Cigarettes  . Smokeless tobacco: Not on file  . Alcohol Use: 0.0 oz/week     Comment: one case per day  . Drug Use: No  . Sexual Activity: No   Other Topics Concern  . Not on file   Social History  Narrative    Review of Systems: Constitutional: Negative for fever, chills, diaphoresis, activity change, appetite change and fatigue. HENT: Negative for ear pain, nosebleeds, congestion, facial swelling, rhinorrhea, neck pain, neck stiffness and ear discharge.  Eyes: Negative for pain, discharge, redness, itching and visual disturbance. Respiratory: Negative for cough, choking, chest tightness, shortness of breath, wheezing and stridor.  Cardiovascular: Negative for chest pain, palpitations and leg swelling. Gastrointestinal: Negative for abdominal distention. Bulging on the right inguinal area Genitourinary: Negative for dysuria, urgency, frequency, hematuria, flank pain, decreased urine volume, difficulty urinating and dyspareunia.  Musculoskeletal: Negative for back pain, joint swelling, arthralgia and gait problem. Neurological: Negative for dizziness, tremors, seizures, syncope, facial asymmetry, speech difficulty, weakness, light-headedness, numbness and headaches.  Hematological: Negative for adenopathy. Does not bruise/bleed easily. Psychiatric/Behavioral: Negative for hallucinations, behavioral problems, confusion, dysphoric mood, decreased concentration and agitation.    Objective:   Filed Vitals:   07/27/14 1533  BP: 126/82  Pulse: 80  Temp: 98 F (36.7 C)  Resp: 16    Physical Exam: Constitutional: Patient appears well-developed and well-nourished. No distress. HENT: Normocephalic, atraumatic, External right and left ear normal. Oropharynx is clear and moist.  Eyes: Conjunctivae and EOM are normal. PERRLA, no scleral icterus. Neck: Normal ROM. Neck supple. No JVD. No  tracheal deviation. No thyromegaly. CVS: RRR, S1/S2 +, no murmurs, no gallops, no carotid bruit.  Pulmonary: Effort and breath sounds normal, no stridor, rhonchi, wheezes, rales.  Abdominal: Soft. BS +, no distension, tenderness, rebound or guarding. Bilateral inguinal hernia with no scrotal extension, no  evidence of incarceration or obstruction.  Musculoskeletal: Normal range of motion. No edema and no tenderness.  Lymphadenopathy: No lymphadenopathy noted, cervical, inguinal or axillary Neuro: Alert. Normal reflexes, muscle tone coordination. No cranial nerve deficit. Skin: Skin is warm and dry. No rash noted. Not diaphoretic. No erythema. No pallor. Psychiatric: Normal mood and affect. Behavior, judgment, thought content normal.  Lab Results  Component Value Date   WBC 9.2 10/29/2012   HGB 15.9 10/29/2012   HCT 44.4 10/29/2012   MCV 87.1 10/29/2012   PLT 198 10/29/2012   Lab Results  Component Value Date   CREATININE 0.77 10/29/2012   BUN 5* 10/29/2012   NA 134* 10/29/2012   K 3.9 10/29/2012   CL 97 10/29/2012   CO2 24 10/29/2012    No results found for: HGBA1C Lipid Panel     Component Value Date/Time   CHOL  08/28/2006 0440    87        ATP III CLASSIFICATION:  <200     mg/dL   Desirable  200-239  mg/dL   Borderline High  >=240    mg/dL   High   TRIG 105 08/28/2006 0440   HDL 15* 08/28/2006 0440   CHOLHDL 5.8 08/28/2006 0440   VLDL 21 08/28/2006 0440   LDLCALC  08/28/2006 0440    51        Total Cholesterol/HDL:CHD Risk Coronary Heart Disease Risk Table                     Men   Women  1/2 Average Risk   3.4   3.3       Assessment and plan:   1. Bilateral recurrent inguinal hernia without obstruction or gangrene  - CBC with Differential/Platelet - COMPLETE METABOLIC PANEL WITH GFR - POCT glycosylated hemoglobin (Hb A1C) - Lipid panel - TSH - Urinalysis, Complete  - Ambulatory referral to General Surgery  2. Cigarette nicotine dependence with nicotine-induced disorder  Edrik was counseled on the dangers of tobacco use, and was advised to quit. Reviewed strategies to maximize success, including removing cigarettes and smoking materials from environment, stress management and support of family/friends.  Return in about 6 months (around 01/27/2015) for  Bilateral Inguinal Hernia.  The patient was given clear instructions to go to ER or return to medical center if symptoms don't improve, worsen or new problems develop. The patient verbalized understanding. The patient was told to call to get lab results if they haven't heard anything in the next week.     This note has been created with Surveyor, quantity. Any transcriptional errors are unintentional.    Angelica Chessman, MD, [place], [place], [place], [place]   07/27/2014, 4:46 PM

## 2014-07-27 NOTE — Patient Instructions (Signed)

## 2014-07-28 LAB — URINALYSIS, COMPLETE
BACTERIA UA: NONE SEEN
BILIRUBIN URINE: NEGATIVE
CRYSTALS: NONE SEEN
Casts: NONE SEEN
GLUCOSE, UA: NEGATIVE mg/dL
Hgb urine dipstick: NEGATIVE
KETONES UR: NEGATIVE mg/dL
Leukocytes, UA: NEGATIVE
NITRITE: NEGATIVE
Protein, ur: NEGATIVE mg/dL
SPECIFIC GRAVITY, URINE: 1.012 (ref 1.005–1.030)
Squamous Epithelial / LPF: NONE SEEN
UROBILINOGEN UA: 0.2 mg/dL (ref 0.0–1.0)
pH: 5.5 (ref 5.0–8.0)

## 2014-07-28 LAB — COMPLETE METABOLIC PANEL WITH GFR
ALBUMIN: 4.3 g/dL (ref 3.5–5.2)
ALT: 10 U/L (ref 0–53)
AST: 13 U/L (ref 0–37)
Alkaline Phosphatase: 48 U/L (ref 39–117)
BUN: 10 mg/dL (ref 6–23)
CO2: 28 meq/L (ref 19–32)
Calcium: 9.7 mg/dL (ref 8.4–10.5)
Chloride: 104 mEq/L (ref 96–112)
Creat: 0.88 mg/dL (ref 0.50–1.35)
GFR, Est African American: >89 mL/min
GFR, Est Non African American: >89 mL/min
Glucose, Bld: 80 mg/dL (ref 70–99)
Potassium: 4.2 mEq/L (ref 3.5–5.3)
SODIUM: 142 meq/L (ref 135–145)
TOTAL PROTEIN: 6.9 g/dL (ref 6.0–8.3)
Total Bilirubin: 0.5 mg/dL (ref 0.2–1.2)

## 2014-07-28 LAB — LIPID PANEL
CHOL/HDL RATIO: 3.4 ratio
CHOLESTEROL: 156 mg/dL (ref 0–200)
HDL: 46 mg/dL (ref >=40)
LDL Cholesterol: 97 mg/dL (ref 0–99)
TRIGLYCERIDES: 64 mg/dL (ref <150)
VLDL: 13 mg/dL (ref 0–40)

## 2014-07-28 LAB — CBC WITH DIFFERENTIAL/PLATELET
Basophils Absolute: 0 10*3/uL (ref 0.0–0.1)
Basophils Relative: 0 % (ref 0–1)
Eosinophils Absolute: 0.6 10*3/uL (ref 0.0–0.7)
Eosinophils Relative: 6 % — ABNORMAL HIGH (ref 0–5)
HEMATOCRIT: 44.3 % (ref 39.0–52.0)
Hemoglobin: 14.7 g/dL (ref 13.0–17.0)
LYMPHS ABS: 3.1 10*3/uL (ref 0.7–4.0)
LYMPHS PCT: 30 % (ref 12–46)
MCH: 28.7 pg (ref 26.0–34.0)
MCHC: 33.2 g/dL (ref 30.0–36.0)
MCV: 86.4 fL (ref 78.0–100.0)
MPV: 9.9 fL (ref 8.6–12.4)
Monocytes Absolute: 0.8 10*3/uL (ref 0.1–1.0)
Monocytes Relative: 8 % (ref 3–12)
Neutro Abs: 5.8 10*3/uL (ref 1.7–7.7)
Neutrophils Relative %: 56 % (ref 43–77)
Platelets: 267 10*3/uL (ref 150–400)
RBC: 5.13 MIL/uL (ref 4.22–5.81)
RDW: 13.8 % (ref 11.5–15.5)
WBC: 10.3 10*3/uL (ref 4.0–10.5)

## 2014-07-28 LAB — TSH: TSH: 1.261 u[IU]/mL (ref 0.350–4.500)

## 2014-07-30 ENCOUNTER — Telehealth: Payer: Self-pay

## 2014-07-30 NOTE — Telephone Encounter (Signed)
-----   Message from Tresa Garter, MD sent at 07/29/2014  6:15 PM EDT ----- Please inform patient that his laboratory test results are all within normal limits.

## 2014-07-30 NOTE — Telephone Encounter (Signed)
Patient is aware of his lab results 

## 2017-11-03 ENCOUNTER — Emergency Department
Admission: EM | Admit: 2017-11-03 | Discharge: 2017-11-03 | Disposition: A | Discharge status: Home / Self Care | Payer: Self-pay | Attending: Emergency Medicine | Admitting: Emergency Medicine

## 2017-11-03 ENCOUNTER — Other Ambulatory Visit: Payer: Self-pay

## 2017-11-03 ENCOUNTER — Encounter: Payer: Self-pay | Admitting: Emergency Medicine

## 2017-11-03 DIAGNOSIS — F17210 Nicotine dependence, cigarettes, uncomplicated: Secondary | ICD-10-CM | POA: Insufficient documentation

## 2017-11-03 DIAGNOSIS — H1032 Unspecified acute conjunctivitis, left eye: Secondary | ICD-10-CM | POA: Insufficient documentation

## 2017-11-03 DIAGNOSIS — I10 Essential (primary) hypertension: Secondary | ICD-10-CM | POA: Insufficient documentation

## 2017-11-03 MED ORDER — OLOPATADINE HCL 0.2 % OP SOLN: 1.0000 [drp] | Freq: Every morning | OPHTHALMIC | 0 refills | Status: DC

## 2017-11-03 NOTE — ED Triage Notes (Signed)
L eye irritation with drainage x 4 days. Denies injury.

## 2017-11-03 NOTE — ED Provider Notes (Signed)
[facility] Emergency Department Provider Note   ____________________________________________   First MD Initiated Contact with Patient 11/03/17 [phone]     (approximate)  I have reviewed the triage vital signs and the nursing notes.   HISTORY  Chief Complaint Eye Problem    HPI Jonathon Hunt is a 52 y.o. male patient complains of clear drainage left eye for 4 days.  Patient denies vision loss.  Patient also complained of itching in both eyes.  Patient rates his pain discomfort a 5/10.  No palliative measure for complaint.  Past Medical History:  Diagnosis Date  . Direct hernia   . EtOH dependence ([place])   . H/O ETOH abuse   . Hypertension   . Pneumonia     Patient Active Problem List   Diagnosis Date Noted  . Bilateral recurrent inguinal hernia without obstruction or gangrene 07/27/2014  . Cigarette nicotine dependence with nicotine-induced disorder 07/27/2014  . Depressive disorder, not elsewhere classified 10/30/2012  . Substance induced mood disorder ([place]) 10/30/2012  . ABUSE, ALCOHOL, EPISODIC 12/05/2006  . DISORDER, TOBACCO USE 12/05/2006  . EMPYEMA 12/05/2006    Past Surgical History:  Procedure Laterality Date  . LUNG SURGERY  2011   put a tube in to drain fluid out of lungs from pneumonia    Prior to Admission medications   Medication Sig Start Date End Date Taking? Authorizing Provider  Olopatadine HCl 0.2 % SOLN Apply 1 drop to eye every morning. 11/03/17   Sable Feil, PA-C    Allergies Pine  Family History  Problem Relation Age of Onset  . Stroke Mother   . Lung cancer Father     Social History Social History   Tobacco Use  . Smoking status: Current Every Day Smoker    Packs/day: 0.25    Years: 30.00    Pack years: 7.50    Types: Cigarettes  Substance Use Topics  . Alcohol use: Yes    Comment: one case per day  . Drug use: No    Review of Systems Constitutional: No fever/chills Eyes: No visual  changes.  Clear drainage left eye. ENT: No sore throat. Cardiovascular: Denies chest pain. Respiratory: Denies shortness of breath. Gastrointestinal: No abdominal pain.  No nausea, no vomiting.  No diarrhea.  No constipation. Genitourinary: Negative for dysuria. Musculoskeletal: Negative for back pain. Skin: Negative for rash. Neurological: Negative for headaches, focal weakness or numbness. Psychiatric:EtOH dependency. Endocrine:Hypertension. Allergic/Immunilogical: Pine  ____________________________________________   PHYSICAL EXAM:  VITAL SIGNS: ED Triage Vitals  Enc Vitals Group     BP 11/03/17 0925 (!) 133/91     Pulse Rate 11/03/17 0924 65     Resp 11/03/17 0924 18     Temp 11/03/17 0924 98.1 F (36.7 C)     Temp Source 11/03/17 0924 Oral     SpO2 11/03/17 0924 99 %     Weight 11/03/17 0925 170 lb (77.1 kg)     Height 11/03/17 0925 5\' 9"  (1.753 m)     Head Circumference --      Peak Flow --      Pain Score 11/03/17 0925 5     Pain Loc --      Pain Edu? --      Excl. in [place]? --     Constitutional: Alert and oriented. Well appearing and in no acute distress. Eyes: Conjunctivae are erythematous. PERRL. EOMI. clear drainage left eye.  See visual acuity. Nose: No congestion/rhinnorhea. Cardiovascular: Normal rate, regular rhythm.  Grossly normal heart sounds.  Good peripheral circulation. Respiratory: Normal respiratory effort.  No retractions. Lungs CTAB. Neurologic:  Normal speech and language. No gross focal neurologic deficits are appreciated. No gait instability. Skin:  Skin is warm, dry and intact. No rash noted. Psychiatric: Mood and affect are normal. Speech and behavior are normal.  ____________________________________________   LABS (all labs ordered are listed, but only abnormal results are displayed)  Labs Reviewed - No data to  display ____________________________________________  EKG   ____________________________________________  RADIOLOGY  ED MD interpretation:    Official radiology report(s): No results found.  ____________________________________________   PROCEDURES  Procedure(s) performed: None  Procedures  Critical Care performed: No  ____________________________________________   INITIAL IMPRESSION / ASSESSMENT AND PLAN / ED COURSE  As part of my medical decision making, I reviewed the following data within the electronic MEDICAL RECORD NUMBER    Drainage left eye secondary to allergic conjunctivitis.  Patient given discharge care instruction advised to use eyedrops as directed.  Follow-up with [place] [facility] if condition persist.      ____________________________________________   FINAL CLINICAL IMPRESSION(S) / ED DIAGNOSES  Final diagnoses:  Acute conjunctivitis of left eye, unspecified acute conjunctivitis type     ED Discharge Orders         Ordered    Olopatadine HCl 0.2 % SOLN   Every morning - 10a     11/03/17 1008           Note:  This document was prepared using Dragon voice recognition software and may include unintentional dictation errors.    Sable Feil, PA-C 11/03/17 [place], MD 11/19/17 [phone]

## 2017-11-03 NOTE — ED Notes (Signed)
See triage note  Presents with pain and swelling to left eye  Unsure if he got something in eye    Visual acuity   Left 20/70  Right eye 20/70  Both eyes with old glasses 20/50

## 2017-11-08 ENCOUNTER — Encounter (HOSPITAL_COMMUNITY): Payer: Self-pay

## 2017-11-08 ENCOUNTER — Ambulatory Visit (HOSPITAL_COMMUNITY)
Admission: EM | Admit: 2017-11-08 | Discharge: 2017-11-08 | Disposition: A | Discharge status: Home / Self Care | Payer: Self-pay | Attending: Family Medicine | Admitting: Family Medicine

## 2017-11-08 DIAGNOSIS — H6122 Impacted cerumen, left ear: Secondary | ICD-10-CM

## 2017-11-08 DIAGNOSIS — G510 Bell's palsy: Secondary | ICD-10-CM

## 2017-11-08 MED ORDER — PREDNISONE 20 MG PO TABS: ORAL_TABLET | ORAL | 0 refills | Status: DC

## 2017-11-08 NOTE — ED Triage Notes (Signed)
Facial swelling and pain.Marland Kitchen1 week

## 2017-11-08 NOTE — Discharge Instructions (Addendum)
Use lubricating gel in left eye before bedtime to keep eye from drying out

## 2017-11-08 NOTE — ED Provider Notes (Signed)
[facility]    CSN: [id number] Arrival date & time: 11/08/17  1123     History   Chief Complaint Chief Complaint  Patient presents with  . Facial Pain    HPI Jonathon Hunt is a 52 y.o. male.   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  He has noticed weakness in the face ever since, affecting only the left side.  He also has some ringing in his left ear with loud noise.  Patient had no fever, weakness on the right, weakness in the arms or legs.  He is noted no rash.  Said no sore throat or trouble swallowing.  He has noticed some drooping in his left eye but he says that with his anti-inflammatory drops, he has not had any discomfort in the eye and he feels that it is closing normally at night.     Past Medical History:  Diagnosis Date  . Direct hernia   . EtOH dependence ([place])   . H/O ETOH abuse   . Hypertension   . Pneumonia     Patient Active Problem List   Diagnosis Date Noted  . Bilateral recurrent inguinal hernia without obstruction or gangrene 07/27/2014  . Cigarette nicotine dependence with nicotine-induced disorder 07/27/2014  . Depressive disorder, not elsewhere classified 10/30/2012  . Substance induced mood disorder ([place]) 10/30/2012  . ABUSE, ALCOHOL, EPISODIC 12/05/2006  . DISORDER, TOBACCO USE 12/05/2006  . EMPYEMA 12/05/2006    Past Surgical History:  Procedure Laterality Date  . LUNG SURGERY  2011   put a tube in to drain fluid out of lungs from pneumonia       Home Medications    Prior to Admission medications   Medication Sig Start Date End Date Taking? Authorizing Provider  Olopatadine HCl 0.2 % SOLN Apply 1 drop to eye every morning. Patient not taking: Reported on 11/08/2017 11/03/17   Sable Feil, PA-C  predniSONE (DELTASONE) 20 MG tablet Two daily with food 11/08/17   Robyn Haber, MD    Family History Family History  Problem Relation Age of  Onset  . Stroke Mother   . Lung cancer Father     Social History Social History   Tobacco Use  . Smoking status: Current Every Day Smoker    Packs/day: 0.25    Years: 30.00    Pack years: 7.50    Types: Cigarettes  . Smokeless tobacco: Current User  Substance Use Topics  . Alcohol use: Yes    Comment: one case per day  . Drug use: No     Allergies   Pine   Review of Systems Review of Systems  Constitutional: Negative.   HENT: Positive for tinnitus.   Neurological: Positive for facial asymmetry.     Physical Exam Triage Vital Signs ED Triage Vitals  Enc Vitals Group     BP 11/08/17 1200 (!) 138/91     Pulse Rate 11/08/17 1200 70     Resp 11/08/17 1200 18     Temp 11/08/17 1158 98.5 F (36.9 C)     Temp Source 11/08/17 1158 Oral     SpO2 11/08/17 1200 100 %     Weight 11/08/17 1200 170 lb (77.1 kg)     Height --      Head Circumference --      Peak Flow --      Pain Score 11/08/17 1159 5  Pain Loc --      Pain Edu? --      Excl. in [place]? --    No data found.  Updated Vital Signs BP (!) 138/91   Pulse 70   Temp 98.5 F (36.9 C)   Resp 18   Wt 77.1 kg   SpO2 100%   BMI 25.10 kg/m   Visual Acuity Right Eye Distance:   Left Eye Distance:   Bilateral Distance:    Right Eye Near:   Left Eye Near:    Bilateral Near:     Physical Exam  Constitutional: He is oriented to person, place, and time. He appears well-developed and well-nourished.  HENT:  Head: Atraumatic.  Right Ear: External ear normal.  Cerumen impaction in left ear canal  Eyes: Pupils are equal, round, and reactive to light. Conjunctivae are normal.  Ptosis on the left but patient is able to completely close his eye  Neck: Normal range of motion. Neck supple.  Pulmonary/Chest: Effort normal.  Neurological: He is alert and oriented to person, place, and time. A cranial nerve deficit is present. No sensory deficit. Coordination abnormal.  Left facial weakness with loss of  forehead horizontal lines on the left only  Skin: Skin is warm and dry.  Nursing note and vitals reviewed.    UC Treatments / Results  Labs (all labs ordered are listed, but only abnormal results are displayed) Labs Reviewed - No data to display  EKG None  Radiology No results found.  Procedures Procedures (including critical care time)  Medications Ordered in UC Medications - No data to display  Initial Impression / Assessment and Plan / UC Course  I have reviewed the triage vital signs and the nursing notes.  Pertinent labs & imaging results that were available during my care of the patient were reviewed by me and considered in my medical decision making (see chart for details).     Final Clinical Impressions(s) / UC Diagnoses   Final diagnoses:  Bell's palsy  Impacted cerumen of left ear     Discharge Instructions     Use lubricating gel in left eye before bedtime to keep eye from drying out    ED Prescriptions    Medication Sig Dispense Auth. Provider   predniSONE (DELTASONE) 20 MG tablet Two daily with food 10 tablet Robyn Haber, MD     Controlled Substance Prescriptions [place] Controlled Substance Registry consulted? Not Applicable   Robyn Haber, MD 11/08/17 1224

## 2020-11-28 ENCOUNTER — Emergency Department: Payer: 59

## 2020-11-28 ENCOUNTER — Emergency Department
Admission: EM | Admit: 2020-11-28 | Discharge: 2020-11-28 | Disposition: A | Discharge status: Home / Self Care | Payer: 59 | Attending: Student in an Organized Health Care Education/Training Program | Admitting: Student in an Organized Health Care Education/Training Program

## 2020-11-28 ENCOUNTER — Other Ambulatory Visit: Payer: Self-pay

## 2020-11-28 DIAGNOSIS — H9203 Otalgia, bilateral: Secondary | ICD-10-CM | POA: Insufficient documentation

## 2020-11-28 DIAGNOSIS — N12 Tubulo-interstitial nephritis, not specified as acute or chronic: Secondary | ICD-10-CM | POA: Diagnosis not present

## 2020-11-28 DIAGNOSIS — I10 Essential (primary) hypertension: Secondary | ICD-10-CM | POA: Insufficient documentation

## 2020-11-28 DIAGNOSIS — D72829 Elevated white blood cell count, unspecified: Secondary | ICD-10-CM | POA: Insufficient documentation

## 2020-11-28 DIAGNOSIS — Z20822 Contact with and (suspected) exposure to covid-19: Secondary | ICD-10-CM | POA: Diagnosis not present

## 2020-11-28 DIAGNOSIS — F17210 Nicotine dependence, cigarettes, uncomplicated: Secondary | ICD-10-CM | POA: Insufficient documentation

## 2020-11-28 DIAGNOSIS — R509 Fever, unspecified: Secondary | ICD-10-CM | POA: Diagnosis present

## 2020-11-28 LAB — COMPREHENSIVE METABOLIC PANEL
ALT: 23 U/L (ref 0–44)
AST: 28 U/L (ref 15–41)
Albumin: 3.8 g/dL (ref 3.5–5.0)
Alkaline Phosphatase: 65 U/L (ref 38–126)
Anion gap: 9 (ref 5–15)
BUN: 11 mg/dL (ref 6–20)
CO2: 25 mmol/L (ref 22–32)
Calcium: 9.2 mg/dL (ref 8.9–10.3)
Chloride: 101 mmol/L (ref 98–111)
Creatinine, Ser: 1.01 mg/dL (ref 0.61–1.24)
GFR, Estimated: >60 mL/min (ref >60)
Glucose, Bld: 112 mg/dL — ABNORMAL HIGH (ref 70–99)
Potassium: 3.5 mmol/L (ref 3.5–5.1)
Sodium: 135 mmol/L (ref 135–145)
Total Bilirubin: 0.6 mg/dL (ref 0.3–1.2)
Total Protein: 7.8 g/dL (ref 6.5–8.1)

## 2020-11-28 LAB — URINALYSIS, COMPLETE (UACMP) WITH MICROSCOPIC
Bilirubin Urine: NEGATIVE
Glucose, UA: NEGATIVE mg/dL
Ketones, ur: NEGATIVE mg/dL
Nitrite: POSITIVE — AB
Protein, ur: 30 mg/dL — AB
Specific Gravity, Urine: 1.01 (ref 1.005–1.030)
WBC, UA: >50 WBC/hpf — ABNORMAL HIGH (ref 0–5)
pH: 6 (ref 5.0–8.0)

## 2020-11-28 LAB — CBC WITH DIFFERENTIAL/PLATELET
Abs Immature Granulocytes: 0.14 10*3/uL — ABNORMAL HIGH (ref 0.00–0.07)
Basophils Absolute: 0 10*3/uL (ref 0.0–0.1)
Basophils Relative: 0 %
Eosinophils Absolute: 0 10*3/uL (ref 0.0–0.5)
Eosinophils Relative: 0 %
HCT: 45.7 % (ref 39.0–52.0)
Hemoglobin: 16.5 g/dL (ref 13.0–17.0)
Immature Granulocytes: 1 %
Lymphocytes Relative: 9 %
Lymphs Abs: 1.2 10*3/uL (ref 0.7–4.0)
MCH: 30.8 pg (ref 26.0–34.0)
MCHC: 36.1 g/dL — ABNORMAL HIGH (ref 30.0–36.0)
MCV: 85.4 fL (ref 80.0–100.0)
Monocytes Absolute: 2 10*3/uL — ABNORMAL HIGH (ref 0.1–1.0)
Monocytes Relative: 15 %
Neutro Abs: 10.2 10*3/uL — ABNORMAL HIGH (ref 1.7–7.7)
Neutrophils Relative %: 75 %
Platelets: 161 10*3/uL (ref 150–400)
RBC: 5.35 MIL/uL (ref 4.22–5.81)
RDW: 13.5 % (ref 11.5–15.5)
WBC: 13.6 10*3/uL — ABNORMAL HIGH (ref 4.0–10.5)
nRBC: 0 % (ref 0.0–0.2)

## 2020-11-28 LAB — RESP PANEL BY RT-PCR (FLU A&B, COVID) ARPGX2
Influenza A by PCR: NEGATIVE
Influenza B by PCR: NEGATIVE
SARS Coronavirus 2 by RT PCR: NEGATIVE

## 2020-11-28 MED ORDER — SODIUM CHLORIDE 0.9 % IV SOLN
1.0000 g | Freq: Once | INTRAVENOUS | Status: AC
  Administered 2020-11-28: 1 g via INTRAVENOUS
  Filled 2020-11-28: qty 10

## 2020-11-28 MED ORDER — SODIUM CHLORIDE 0.9 % IV BOLUS
1000.0000 mL | Freq: Once | INTRAVENOUS | Status: AC
  Administered 2020-11-28: 1000 mL via INTRAVENOUS

## 2020-11-28 MED ORDER — CEPHALEXIN 500 MG PO CAPS: 500.0000 mg | ORAL_CAPSULE | Freq: Four times a day (QID) | ORAL | 0 refills | Status: DC

## 2020-11-28 NOTE — ED Provider Notes (Signed)
[facility]-EMERGENCY DEPARTMENT  ____________________________________________  Time seen: Approximately 8:00 PM  I have reviewed the triage vital signs and the nursing notes.   HISTORY  Chief Complaint Fever   Historian Patient     HPI Jonathon Hunt is a 55 y.o. male presents to the emergency department with chills since Thursday.  Patient reports that he has had a sporadic cough but no other symptoms.  No nausea, vomiting and diarrhea.  No abdominal pain.  Patient states that he has had an occasional headache.  No chest pain, chest tightness or shortness of breath.   Past Medical History:  Diagnosis Date   Direct hernia    EtOH dependence ([place])    H/O ETOH abuse    Hypertension    Pneumonia      Immunizations up to date:  Yes.     Past Medical History:  Diagnosis Date   Direct hernia    EtOH dependence ([place])    H/O ETOH abuse    Hypertension    Pneumonia     Patient Active Problem List   Diagnosis Date Noted   Bilateral recurrent inguinal hernia without obstruction or gangrene 07/27/2014   Cigarette nicotine dependence with nicotine-induced disorder 07/27/2014   Depressive disorder, not elsewhere classified 10/30/2012   Substance induced mood disorder ([place]) 10/30/2012   ABUSE, ALCOHOL, EPISODIC 12/05/2006   DISORDER, TOBACCO USE 12/05/2006   EMPYEMA 12/05/2006    Past Surgical History:  Procedure Laterality Date   LUNG SURGERY  2011   put a tube in to drain fluid out of lungs from pneumonia    Prior to Admission medications   Medication Sig Start Date End Date Taking? Authorizing Provider  cephALEXin (KEFLEX) 500 MG capsule Take 1 capsule (500 mg total) by mouth 4 (four) times daily for 7 days. 11/28/20 12/05/20 Yes Vallarie Mare M, PA-C  Olopatadine HCl 0.2 % SOLN Apply 1 drop to eye every morning. Patient not taking: Reported on 11/08/2017 11/03/17   Sable Feil, PA-C  predniSONE (DELTASONE) 20 MG tablet Two daily with food 11/08/17   Robyn Haber, MD    Allergies [place]  Family History  Problem Relation Age of Onset   Stroke Mother    Lung cancer Father     Social History Social History   Tobacco Use   Smoking status: Every Day    Packs/day: 0.25    Years: 30.00    Pack years: 7.50    Types: Cigarettes   Smokeless tobacco: Current  Substance Use Topics   Alcohol use: Yes    Comment: one case per day   Drug use: No     Review of Systems  Constitutional: Patient has fever.  Eyes:  No discharge ENT: No upper respiratory complaints. Respiratory: no cough. No SOB/ use of accessory muscles to breath Gastrointestinal:   No nausea, no vomiting.  No diarrhea.  No constipation. Musculoskeletal: Negative for musculoskeletal pain. Skin: Negative for rash, abrasions, lacerations, ecchymosis.    ____________________________________________   PHYSICAL EXAM:  VITAL SIGNS: ED Triage Vitals  Enc Vitals Group     BP 11/28/20 1842 (!) 137/101     Pulse Rate 11/28/20 1842 99     Resp 11/28/20 1842 20     Temp 11/28/20 1842 99.3 F (37.4 C)     Temp Source 11/28/20 1842 Oral     SpO2 11/28/20 1842 97 %     Weight 11/28/20 1843 170 lb (77.1 kg)     Height 11/28/20 1843 5'  9" (1.753 m)     Head Circumference --      Peak Flow --      Pain Score 11/28/20 1843 0     Pain Loc --      Pain Edu? --      Excl. in [place]? --      Constitutional: Alert and oriented. Patient is lying supine. Eyes: Conjunctivae are normal. PERRL. EOMI. Head: Atraumatic. ENT:      Ears: Tympanic membranes are mildly injected with mild effusion bilaterally.       Nose: No congestion/rhinnorhea.      Mouth/Throat: Mucous membranes are moist. Posterior pharynx is mildly erythematous.  Hematological/Lymphatic/Immunilogical: No cervical lymphadenopathy.  Cardiovascular: Normal rate, regular rhythm. Normal S1 and S2.  Good peripheral circulation. Respiratory: Normal respiratory effort without tachypnea or retractions. Lungs CTAB. Good air  entry to the bases with no decreased or absent breath sounds. Gastrointestinal: Bowel sounds 4 quadrants. Soft and nontender to palpation. No guarding or rigidity. No palpable masses. No distention. No CVA tenderness. Musculoskeletal: Full range of motion to all extremities. No gross deformities appreciated. Neurologic:  Normal speech and language. No gross focal neurologic deficits are appreciated.  Skin:  Skin is warm, dry and intact. No rash noted. Psychiatric: Mood and affect are normal. Speech and behavior are normal. Patient exhibits appropriate insight and judgement.   ____________________________________________   LABS (all labs ordered are listed, but only abnormal results are displayed)  Labs Reviewed  CBC WITH DIFFERENTIAL/PLATELET - Abnormal; Notable for the following components:      Result Value   WBC 13.6 (*)    MCHC 36.1 (*)    Neutro Abs 10.2 (*)    Monocytes Absolute 2.0 (*)    Abs Immature Granulocytes 0.14 (*)    All other components within normal limits  COMPREHENSIVE METABOLIC PANEL - Abnormal; Notable for the following components:   Glucose, Bld 112 (*)    All other components within normal limits  URINALYSIS, COMPLETE (UACMP) WITH MICROSCOPIC - Abnormal; Notable for the following components:   APPearance HAZY (*)    Hgb urine dipstick MODERATE (*)    Protein, ur 30 (*)    Nitrite POSITIVE (*)    Leukocytes,Ua SMALL (*)    WBC, UA >50 (*)    Bacteria, UA MANY (*)    Non Squamous Epithelial PRESENT (*)    All other components within normal limits  RESP PANEL BY RT-PCR (FLU A&B, COVID) [id number]   ____________________________________________  EKG   ____________________________________________  RADIOLOGY Unk Pinto, personally viewed and evaluated these images (plain radiographs) as part of my medical decision making, as well as reviewing the written report by the radiologist.    DG Chest 1 View  Result Date: 11/28/2020 CLINICAL DATA:  Fever,  chills, chest congestion EXAM: CHEST  1 VIEW COMPARISON:  12/03/2008 FINDINGS: The heart size and mediastinal contours are within normal limits. Both lungs are clear. The visualized skeletal structures are unremarkable. IMPRESSION: No active disease. Electronically Signed   By: Fidela Salisbury M.D.   On: 11/28/2020 20:18   CT Renal Stone Study  Result Date: 11/28/2020 CLINICAL DATA:  Flank pain, kidney stone suspected.  Fever EXAM: CT ABDOMEN AND PELVIS WITHOUT CONTRAST TECHNIQUE: Multidetector CT imaging of the abdomen and pelvis was performed following the standard protocol without IV contrast. COMPARISON:  10/03/2007 FINDINGS: Lower chest: No acute abnormality. Hepatobiliary: No focal hepatic abnormality. Gallbladder unremarkable. Pancreas: Calcifications in the region of the pancreatic head compatible with  chronic pancreatitis. No ductal dilatation. Spleen: No focal abnormality.  Normal size. Adrenals/Urinary Tract: No adrenal abnormality. No focal renal abnormality. No stones or hydronephrosis. Urinary bladder is unremarkable. Stomach/Bowel: Multiple small bowel loops seen within a large right inguinal hernia. No bowel obstruction. Normal appendix. Vascular/Lymphatic: Aortic atherosclerosis. No evidence of aneurysm or adenopathy. Reproductive: Prostate enlargement. Other: No free fluid or free air. Bilateral inguinal hernias, the right containing small bowel loops and the left containing fat. Musculoskeletal: No acute bony abnormality. IMPRESSION: No renal or ureteral stones.  No hydronephrosis. Changes of chronic pancreatitis in the pancreatic head. Bilateral inguinal hernias, right larger than left. The right inguinal hernia contains multiple small bowel loops. No bowel obstruction. Aortic atherosclerosis. Electronically Signed   By: Rolm Baptise M.D.   On: 11/28/2020 21:37    ____________________________________________    PROCEDURES  Procedure(s) performed:     Procedures     Medications   sodium chloride 0.9 % bolus 1,000 mL (0 mLs Intravenous Stopped 11/28/20 2202)  cefTRIAXone (ROCEPHIN) 1 g in sodium chloride 0.9 % 100 mL IVPB (0 g Intravenous Stopped 11/28/20 2202)     ____________________________________________   INITIAL IMPRESSION / ASSESSMENT AND PLAN / ED COURSE  Pertinent labs & imaging results that were available during my care of the patient were reviewed by me and considered in my medical decision making (see chart for details).      Assessment and Plan:  Fever Pyelonephritis 55 year old male presents to the emergency department with fever since Thursday and chills.  COVID and influenza testing were negative.  CBC indicated mild leukocytosis with left shift.  CMP within reference range.  Urinalysis concerning for blood, nitrites and many bacteria.  Urine culture is in process at this time.    CT renal stone study was obtained which showed no evidence of nephrolithiasis.  We will treat for pyelonephritis with IV Rocephin and Keflex as an outpatient.  Strict return precautions were given to return with new or worsening symptoms.  Patient was also given supplemental fluids during this emergency department encounter.     ____________________________________________  FINAL CLINICAL IMPRESSION(S) / ED DIAGNOSES  Final diagnoses:  Fever  Pyelonephritis      NEW MEDICATIONS STARTED DURING THIS VISIT:  ED Discharge Orders          Ordered    cephALEXin (KEFLEX) 500 MG capsule  4 times daily        11/28/20 2154                This chart was dictated using voice recognition software/Dragon. Despite best efforts to proofread, errors can occur which can change the meaning. Any change was purely unintentional.     Lannie Fields, PA-C 11/28/20 2225    Merlyn Lot, MD 11/29/20 1053

## 2020-11-28 NOTE — ED Triage Notes (Signed)
Pt states he has had a fever and cold chills since Thursday- pt denies n/v/d and abd pain- pt also having congestion and headaches

## 2020-11-28 NOTE — Discharge Instructions (Addendum)
Take Keflex four times daily for the next seven days.  

## 2020-11-30 ENCOUNTER — Emergency Department (HOSPITAL_COMMUNITY)
Admission: EM | Admit: 2020-11-30 | Discharge: 2020-11-30 | Disposition: A | Discharge status: Home / Self Care | Payer: 59 | Attending: Emergency Medicine | Admitting: Emergency Medicine

## 2020-11-30 ENCOUNTER — Emergency Department (HOSPITAL_COMMUNITY): Payer: 59

## 2020-11-30 ENCOUNTER — Encounter (HOSPITAL_COMMUNITY): Payer: Self-pay | Admitting: Emergency Medicine

## 2020-11-30 DIAGNOSIS — I10 Essential (primary) hypertension: Secondary | ICD-10-CM | POA: Insufficient documentation

## 2020-11-30 DIAGNOSIS — F17210 Nicotine dependence, cigarettes, uncomplicated: Secondary | ICD-10-CM | POA: Insufficient documentation

## 2020-11-30 DIAGNOSIS — Z79899 Other long term (current) drug therapy: Secondary | ICD-10-CM | POA: Insufficient documentation

## 2020-11-30 DIAGNOSIS — K4021 Bilateral inguinal hernia, without obstruction or gangrene, recurrent: Secondary | ICD-10-CM | POA: Insufficient documentation

## 2020-11-30 DIAGNOSIS — R1030 Lower abdominal pain, unspecified: Secondary | ICD-10-CM | POA: Diagnosis not present

## 2020-11-30 DIAGNOSIS — N5082 Scrotal pain: Secondary | ICD-10-CM | POA: Diagnosis present

## 2020-11-30 DIAGNOSIS — N451 Epididymitis: Secondary | ICD-10-CM | POA: Diagnosis not present

## 2020-11-30 LAB — COMPREHENSIVE METABOLIC PANEL
ALT: 12 U/L (ref 0–44)
ALT: 24 U/L (ref 0–44)
AST: 22 U/L (ref 15–41)
AST: 21 U/L (ref 15–41)
Albumin: 1.6 g/dL — ABNORMAL LOW (ref 3.5–5.0)
Albumin: 2.9 g/dL — ABNORMAL LOW (ref 3.5–5.0)
Alkaline Phosphatase: 27 U/L — ABNORMAL LOW (ref 38–126)
Alkaline Phosphatase: 53 U/L (ref 38–126)
Anion gap: 5 (ref 5–15)
Anion gap: 9 (ref 5–15)
BUN: 5 mg/dL — ABNORMAL LOW (ref 6–20)
BUN: 6 mg/dL (ref 6–20)
CO2: 16 mmol/L — ABNORMAL LOW (ref 22–32)
CO2: 25 mmol/L (ref 22–32)
Calcium: 5.1 mg/dL — CL (ref 8.9–10.3)
Calcium: 8.8 mg/dL — ABNORMAL LOW (ref 8.9–10.3)
Chloride: 116 mmol/L — ABNORMAL HIGH (ref 98–111)
Chloride: 103 mmol/L (ref 98–111)
Creatinine, Ser: 0.44 mg/dL — ABNORMAL LOW (ref 0.61–1.24)
Creatinine, Ser: 0.78 mg/dL (ref 0.61–1.24)
GFR, Estimated: >60 mL/min (ref >60)
GFR, Estimated: >60 mL/min (ref >60)
Glucose, Bld: 63 mg/dL — ABNORMAL LOW (ref 70–99)
Glucose, Bld: 97 mg/dL (ref 70–99)
Potassium: 3.5 mmol/L (ref 3.5–5.1)
Potassium: 3.6 mmol/L (ref 3.5–5.1)
Sodium: 137 mmol/L (ref 135–145)
Sodium: 137 mmol/L (ref 135–145)
Total Bilirubin: 1.2 mg/dL (ref 0.3–1.2)
Total Bilirubin: 0.5 mg/dL (ref 0.3–1.2)
Total Protein: 3.3 g/dL — ABNORMAL LOW (ref 6.5–8.1)
Total Protein: 6.2 g/dL — ABNORMAL LOW (ref 6.5–8.1)

## 2020-11-30 LAB — URINALYSIS, ROUTINE W REFLEX MICROSCOPIC
Bilirubin Urine: NEGATIVE
Glucose, UA: NEGATIVE mg/dL
Ketones, ur: NEGATIVE mg/dL
Nitrite: NEGATIVE
Protein, ur: NEGATIVE mg/dL
Specific Gravity, Urine: 1.01 (ref 1.005–1.030)
pH: 6 (ref 5.0–8.0)

## 2020-11-30 LAB — CBC WITH DIFFERENTIAL/PLATELET
Abs Immature Granulocytes: 0.12 10*3/uL — ABNORMAL HIGH (ref 0.00–0.07)
Basophils Absolute: 0.1 10*3/uL (ref 0.0–0.1)
Basophils Relative: 0 %
Eosinophils Absolute: 0.2 10*3/uL (ref 0.0–0.5)
Eosinophils Relative: 1 %
HCT: 42.7 % (ref 39.0–52.0)
Hemoglobin: 14.4 g/dL (ref 13.0–17.0)
Immature Granulocytes: 1 %
Lymphocytes Relative: 13 %
Lymphs Abs: 1.6 10*3/uL (ref 0.7–4.0)
MCH: 28.9 pg (ref 26.0–34.0)
MCHC: 33.7 g/dL (ref 30.0–36.0)
MCV: 85.6 fL (ref 80.0–100.0)
Monocytes Absolute: 1.9 10*3/uL — ABNORMAL HIGH (ref 0.1–1.0)
Monocytes Relative: 15 %
Neutro Abs: 8.7 10*3/uL — ABNORMAL HIGH (ref 1.7–7.7)
Neutrophils Relative %: 70 %
Platelets: 208 10*3/uL (ref 150–400)
RBC: 4.99 MIL/uL (ref 4.22–5.81)
RDW: 13.8 % (ref 11.5–15.5)
WBC: 12.5 10*3/uL — ABNORMAL HIGH (ref 4.0–10.5)
nRBC: 0 % (ref 0.0–0.2)

## 2020-11-30 LAB — LACTIC ACID, PLASMA: Lactic Acid, Venous: 1.3 mmol/L (ref 0.5–1.9)

## 2020-11-30 LAB — URINALYSIS, MICROSCOPIC (REFLEX): Squamous Epithelial / HPF: NONE SEEN (ref 0–5)

## 2020-11-30 MED ORDER — LEVOFLOXACIN 500 MG PO TABS
500.0000 mg | ORAL_TABLET | Freq: Once | ORAL | Status: AC
  Administered 2020-11-30: 500 mg via ORAL
  Filled 2020-11-30 (×2): qty 1

## 2020-11-30 MED ORDER — LEVOFLOXACIN 500 MG PO TABS: 500.0000 mg | ORAL_TABLET | Freq: Every day | ORAL | 0 refills | Status: DC

## 2020-11-30 MED ORDER — ONDANSETRON HCL 4 MG/2ML IJ SOLN: 4.0000 mg | Freq: Once | INTRAMUSCULAR | Status: DC

## 2020-11-30 MED ORDER — FENTANYL CITRATE PF 50 MCG/ML IJ SOSY
100.0000 ug | PREFILLED_SYRINGE | Freq: Once | INTRAMUSCULAR | Status: DC
Start: 2020-11-30 — End: 2020-11-30

## 2020-11-30 MED ORDER — IOHEXOL 350 MG/ML SOLN
70.0000 mL | Freq: Once | INTRAVENOUS | Status: AC | PRN
  Administered 2020-11-30: 70 mL via INTRAVENOUS

## 2020-11-30 NOTE — ED Notes (Signed)
Pt transported to [place] at stretcher at this time.

## 2020-11-30 NOTE — Discharge Instructions (Addendum)
Stop taking the Keflex. Start taking the new medication  Follow up with the general surgeon for your hernias. I would also recommend getting a hernia belt which can be bought at a medical supply store. Call to schedule and appointment. Number is listed on your discharge paper work

## 2020-11-30 NOTE — ED Notes (Signed)
Pt transported to CT via stretcher at this time.  

## 2020-11-30 NOTE — ED Triage Notes (Signed)
Patient with hernia complains of sudden groin pain that started after sitting down last Thursday. Also reports swelling of scrotum since that day.

## 2020-11-30 NOTE — ED Notes (Signed)
Pt transported back to room from [place]. CT aware pt is ready for CT scan.

## 2020-11-30 NOTE — ED Notes (Signed)
Britni PA notified of critical calcium 5.1.

## 2020-11-30 NOTE — ED Provider Notes (Signed)
[facility]ERGENCY DEPARTMENT Provider Note   CSN: [id number] Arrival date & time: 11/30/20  0800    History Chief Complaint  Patient presents with   Groin Pain    Jonathon Hunt is a 55 y.o. male with past medical history significant for inguinal hernias, etoh use HTN who presents for evaluation of worsening right scrotal pain. Initially began on Thursday with fever and dysuria. Seen at [facility] dx with pyelonephritis. CT stone showed Bl hernias with bowel however no incarceration. Patient states no longer having fever since abx however still having some mild dysuria and scrotal pain. Rates pain a 8/10. Denies scrotal rash, redness, discharge, fever, emesis, flank pain, diarrhea. Able to void without difficulty. Denies additional aggrieving or alleviating factors.  History obtained by patient and past medical records. No interpretor was used.   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/05/2006   DISORDER, TOBACCO USE 12/05/2006   EMPYEMA 12/05/2006    Past Surgical History:  Procedure Laterality Date   LUNG SURGERY  2011   put a tube in to drain fluid out of lungs from pneumonia       Family History  Problem Relation Age of Onset   Stroke Mother    Lung cancer Father     Social History   Tobacco Use   Smoking status: Every Day    Packs/day: 0.25    Years: 30.00    Pack years: 7.50    Types: Cigarettes   Smokeless tobacco: Current  Substance Use Topics   Alcohol use: Yes    Comment: one case per day    Drug use: No    Home Medications Prior to Admission medications   Medication Sig Start Date End Date Taking? Authorizing Provider  levofloxacin (LEVAQUIN) 500 MG tablet Take 1 tablet (500 mg total) by mouth daily. 11/30/20  Yes Zoanne Newill A, PA-C  predniSONE (DELTASONE) 20 MG tablet Two daily with food 11/08/17   Robyn Haber, MD    Allergies    [place]  Review of Systems   Review of Systems  Constitutional: Negative.   HENT: Negative.    Respiratory: Negative.    Cardiovascular: Negative.   Gastrointestinal:  Positive for abdominal pain. Negative for constipation, diarrhea, nausea, rectal pain and vomiting.  Genitourinary:  Positive for dysuria, scrotal swelling and testicular pain. Negative for decreased urine volume, flank pain, frequency, genital sores, hematuria, penile discharge, penile pain, penile swelling and urgency.  Musculoskeletal: Negative.   Skin: Negative.   Neurological: Negative.   All other systems reviewed and are negative.  Physical Exam Updated Vital Signs BP (!) 152/96   Pulse 77   Temp 98.6 F (37 C) (Oral)   Resp 14   SpO2 100%   Physical Exam Vitals and nursing note reviewed. Exam conducted with a chaperone present.  Constitutional:      General: He is not in acute distress.    Appearance: He is well-developed. He is not ill-appearing, toxic-appearing or diaphoretic.  HENT:  Head: Normocephalic and atraumatic.     Nose: Nose normal.     Mouth/Throat:     Mouth: Mucous membranes are moist.  Eyes:     Pupils: Pupils are equal, round, and reactive to light.  Cardiovascular:     Rate and Rhythm: Normal rate and regular rhythm.     Pulses: Normal pulses.     Heart sounds: Normal heart sounds.  Pulmonary:     Effort: Pulmonary effort is normal. No respiratory distress.     Breath sounds: Normal breath sounds.  Abdominal:     General: Bowel sounds are normal. There is no distension.     Palpations: Abdomen is soft.     Comments:  Fullness to BL inguinal area R>L  Genitourinary:    Penis: Normal.      Testes:        Right: Tenderness and swelling present.     Comments: Chaperone presents. Normal penis. Hernia present. Tenderness and swelling to right scrotum.  Musculoskeletal:        General: Normal range of motion.     Cervical back: Normal range of motion and neck supple.  Skin:    General: Skin is warm and dry.  Neurological:     General: No focal deficit present.     Mental Status: He is alert and oriented to person, place, and time.    ED Results / Procedures / Treatments   Labs (all labs ordered are listed, but only abnormal results are displayed) Labs Reviewed  CBC WITH DIFFERENTIAL/PLATELET - Abnormal; Notable for the following components:      Result Value   WBC 12.5 (*)    Neutro Abs 8.7 (*)    Monocytes Absolute 1.9 (*)    Abs Immature Granulocytes 0.12 (*)    All other components within normal limits  URINALYSIS, ROUTINE W REFLEX MICROSCOPIC - Abnormal; Notable for the following components:   Hgb urine dipstick TRACE (*)    Leukocytes,Ua TRACE (*)    All other components within normal limits  COMPREHENSIVE METABOLIC PANEL - Abnormal; Notable for the following components:   Chloride 116 (*)    CO2 16 (*)    Glucose, Bld 63 (*)    BUN 5 (*)    Creatinine, Ser 0.44 (*)    Calcium 5.1 (*)    Total Protein 3.3 (*)    Albumin 1.6 (*)    Alkaline Phosphatase 27 (*)    All other components within normal limits  URINALYSIS, MICROSCOPIC (REFLEX) - Abnormal; Notable for the following components:   Bacteria, UA RARE (*)    All other components within normal limits  COMPREHENSIVE METABOLIC PANEL - Abnormal; Notable for the following components:   Calcium 8.8 (*)    Total Protein 6.2 (*)    Albumin 2.9 (*)    All other components within normal limits  URINE CULTURE  LACTIC ACID, PLASMA    EKG None  Radiology DG Chest 1 View  Result Date: 11/28/2020 CLINICAL DATA:  Fever, chills, chest  congestion EXAM: CHEST  1 VIEW COMPARISON:  12/03/2008 FINDINGS: The heart size and mediastinal contours are within normal limits. Both lungs are clear. The visualized skeletal structures are unremarkable. IMPRESSION: No active disease. Electronically Signed   By: Fidela Salisbury M.D.   On: 11/28/2020 20:18   CT Abdomen Pelvis W Contrast  Addendum Date: 11/30/2020   ADDENDUM REPORT: 11/30/2020 11:31 ADDENDUM: There is a 1.1 cm portacaval lymph node, nonspecific and not significantly changed going back  to 2009. There is no other lymphadenopathy in the abdomen or pelvis. Electronically Signed   By: Valetta Mole M.D.   On: 11/30/2020 11:31   Result Date: 11/30/2020 CLINICAL DATA:  Abdominal pain, hernia suspected EXAM: CT ABDOMEN AND PELVIS WITH CONTRAST TECHNIQUE: Multidetector CT imaging of the abdomen and pelvis was performed using the standard protocol following bolus administration of intravenous contrast. CONTRAST:  66m OMNIPAQUE IOHEXOL 350 MG/ML SOLN COMPARISON:  CT abdomen/pelvis 11/28/2020 FINDINGS: Lower chest: There is dependent subsegmental atelectasis and scarring in the lung bases. Nodular scarring in the lateral right base is unchanged since 20/10. Coronary artery calcifications are noted. The imaged heart is otherwise unremarkable. Hepatobiliary: A small hypodense lesion in segment IVB likely reflects a cyst. There are no other focal lesions. The gallbladder is normal. There is no biliary ductal dilatation. Pancreas: Calcifications about the pancreatic head are unchanged, again suggesting chronic pancreatitis. There are no focal lesions or contour abnormalities. Otherwise, there is no main pancreatic ductal dilatation or peripancreatic inflammatory change. Spleen: The spleen is unremarkable. Adrenals/Urinary Tract: A small hypodense lesion in the left lower pole is too small to characterize but likely reflects a cyst. There are no other focal lesions. There are no stones. There is no hydronephrosis  or hydroureter. The bladder is decompressed but grossly unremarkable. Stomach/Bowel: The stomach is unremarkable. There is no evidence of bowel obstruction. There is no abnormal bowel wall thickening or inflammatory change. The appendix is normal. A loop of small bowel is again herniated through the right inguinal canal without evidence of obstruction or strangulation. Vascular/Lymphatic: There is scattered calcified atherosclerotic plaque throughout the nonaneurysmal abdominal aorta. The main portal and splenic veins are patent. Reproductive: The prostate is mildly enlarged, unchanged. Other: There is no ascites or free air. There is a fat containing left inguinal hernia and a bowel containing right inguinal hernia. There is a small amount of fluid in the right inguinal hernia. Findings are unchanged. There is no evidence of obstruction or strangulation. Musculoskeletal: There is deformity of the left ninth rib likely reflecting prior trauma. There is no acute osseous abnormality or aggressive osseous lesion. There is multilevel degenerative change of the lumbar spine. IMPRESSION: 1. Unchanged fat containing left and bowel containing right inguinal hernias without evidence of obstruction or strangulation. 2. Stable findings of chronic pancreatitis. 3. Aortic Atherosclerosis (ICD10-I70.0). Electronically Signed: By: PValetta MoleM.D. On: 11/30/2020 11:18   CT Renal Stone Study  Result Date: 11/28/2020 CLINICAL DATA:  Flank pain, kidney stone suspected.  Fever EXAM: CT ABDOMEN AND PELVIS WITHOUT CONTRAST TECHNIQUE: Multidetector CT imaging of the abdomen and pelvis was performed following the standard protocol without IV contrast. COMPARISON:  10/03/2007 FINDINGS: Lower chest: No acute abnormality. Hepatobiliary: No focal hepatic abnormality. Gallbladder unremarkable. Pancreas: Calcifications in the region of the pancreatic head compatible with chronic pancreatitis. No ductal dilatation. Spleen: No focal  abnormality.  Normal size. Adrenals/Urinary Tract: No adrenal abnormality. No focal renal abnormality. No stones or hydronephrosis. Urinary bladder is unremarkable. Stomach/Bowel: Multiple small bowel loops seen within a large right inguinal hernia. No bowel obstruction. Normal appendix. Vascular/Lymphatic: Aortic atherosclerosis. No evidence of aneurysm or adenopathy. Reproductive: Prostate enlargement. Other: No free fluid or free air. Bilateral inguinal hernias, the right containing small bowel loops and the left containing fat. Musculoskeletal: No acute bony abnormality. IMPRESSION: No renal or ureteral stones.  No hydronephrosis. Changes of chronic pancreatitis in the pancreatic head. Bilateral inguinal hernias, right larger than left. The right inguinal hernia contains multiple  small bowel loops. No bowel obstruction. Aortic atherosclerosis. Electronically Signed   By: Rolm Baptise M.D.   On: 11/28/2020 21:37   US SCROTUM W/DOPPLER  Result Date: 11/30/2020 CLINICAL DATA:  Pain EXAM: SCROTAL ULTRASOUND DOPPLER ULTRASOUND OF THE TESTICLES TECHNIQUE: Complete ultrasound examination of the testicles, epididymis, and other scrotal structures was performed. Color and spectral Doppler ultrasound were also utilized to evaluate blood flow to the testicles. COMPARISON:  None. FINDINGS: Right testicle Measurements: 2.9 x 3.5 x 4.1 cm. No mass or microlithiasis visualized. Left testicle Measurements: 2.9 x 2.8 x 2.8 cm. No mass or microlithiasis visualized. Right epididymis: Prominent with asymmetrically increased flow on color Doppler. Left epididymis:  Subcentimeter cyst.  Otherwise unremarkable. Hydrocele:  Present bilaterally. Varicocele:  Present on the left. Pulsed Doppler interrogation of both testes demonstrates normal low resistance arterial and venous waveforms bilaterally. IMPRESSION: No evidence of torsion. Prominent right epididymis with increased flow on color Doppler may reflect epididymitis. Bilateral  hydroceles.  Left varicocele. Electronically Signed   By: Macy Mis M.D.   On: 11/30/2020 11:13    Procedures Procedures   Medications Ordered in ED Medications  ondansetron (ZOFRAN) injection 4 mg (0 mg Intravenous Hold 11/30/20 1021)  levofloxacin (LEVAQUIN) tablet 500 mg (has no administration in time range)  iohexol (OMNIPAQUE) 350 MG/ML injection 70 mL (70 mLs Intravenous Contrast Given 11/30/20 1059)    ED Course  I have reviewed the triage vital signs and the nursing notes.  Pertinent labs & imaging results that were available during my care of the patient were reviewed by me and considered in my medical decision making (see chart for details).  Here for eval of scrotal pain, Afebrile, non septic non ill appearing. Seen at OSH dx with UTI, no urine culture sent however UA was positive. Compliant with Keflex at home. GU exam with fullness to suprapubic region R>L and tenderness to right scrotum with swelling. No rectal pain, pain with BM. Plan on labs, image and reassess.  Labs and imaging personally reviewed and interpreted:  CBC leukocytosis at 12.5 however improved from 2 days ago CMP with significant abnormalities, suspect lab error give normal 2 day ago >>> Repeat CMP without significant abnormality, Feel this lab is correct given comparison to prior labs Lactic 1.3 UA improved from 2 days prior CT with hernia however no incarceration or strangulation  [place] with epididymitis   Patient reassessed. Pain controlled. Discussed hernia attire, FU with gen surgery and switch from Keflex to Levaquin and FU with PCP. Patient agreeable  Patient is nontoxic, nonseptic appearing, in no apparent distress.  Patient's pain and other symptoms adequately managed in emergency department.  Fluid bolus given.  Labs, imaging and vitals reviewed.  Patient does not meet the SIRS or Sepsis criteria.  On repeat exam patient does not have a surgical abdomin and there are no peritoneal signs.  No  indication of appendicitis, bowel obstruction, bowel perforation, cholecystitis, diverticulitis, AAA, dissection.   Clinical Course as of 11/30/20 1414  Tue Nov 30, 2020  0954 This is a 55 year old male presenting the emergency department with lower abdominal pain and scrotal pain ongoing for several days.  He was seen in the ED on September 4, 2 days ago, and had her CT renal stone study, which at that time was notable for multiple bowel loops and a right inguinal hernia.  He reports he has had persistent pain in the right inguinal region since discharge.  He does report he had a regular bowel movement  today.  He denies nausea, vomiting, belching.  On exam the patient overall appears comfortable.  He is afebrile.  He does have a palpable soft inguinal hernia on the right side, and also an enlarged right testicle compared to the left, with some epididymal tenderness.  Differential diagnosis includes epididymitis versus orchitis versus incarcerated or symptomatic hernia.  Clinically does not appear to be a strangulated hernia.  He is quite comfortable on exam.  Labs and imaging ordered. [MT]  0956 UA from prior visit was positive for leukocytes and nitrites, patient was started on Keflex and has been compliant with it.  Unfortunately no urine culture was sent at the time. [MT]  [id number] Work-up is consistent with acute epididymitis.  No evidence of strangulation on CT scan, and clinically I think this is much more consistent with epididymitis and with strangulated or incarcerated hernia.  We will need to switch his antibiotics, from Keflex to  levaquin.  First dose ordered here.  I discussed the work-up and the treatment plan with the patient. [MT]  1141 Urinalysis, Routine w reflex microscopic Urine, Clean Catch [NW]  [id number] I suspect CMP was a lab error with these marked abnormalities and deviations from his labs only 2 days ago.  I have asked the nurse to repeat his test [MT]  1249 Comprehensive metabolic  panel(!!) Suspect abnormal labs from lab error given normal 2 days ago. Will repeat [BH]    Clinical Course User Index [BH] Leng Montesdeoca A, PA-C [MT] Trifan, Carola Rhine, MD [NW] Vena Rua   MDM Rules/Calculators/A&P                            Final Clinical Impression(s) / ED Diagnoses Final diagnoses:  Epididymitis  Bilateral recurrent inguinal hernia without obstruction or gangrene    Rx / DC Orders ED Discharge Orders          Ordered    levofloxacin (LEVAQUIN) 500 MG tablet  Daily        11/30/20 1140             Jb Dulworth A, PA-C 11/30/20 1414    Wyvonnia Dusky, MD 11/30/20 1520

## 2020-12-08 ENCOUNTER — Other Ambulatory Visit: Payer: Self-pay

## 2020-12-08 ENCOUNTER — Emergency Department: Payer: 59

## 2020-12-08 ENCOUNTER — Encounter: Payer: Self-pay | Admitting: Emergency Medicine

## 2020-12-08 ENCOUNTER — Emergency Department
Admission: EM | Admit: 2020-12-08 | Discharge: 2020-12-08 | Disposition: A | Discharge status: Home / Self Care | Payer: 59 | Attending: Emergency Medicine | Admitting: Emergency Medicine

## 2020-12-08 DIAGNOSIS — M7918 Myalgia, other site: Secondary | ICD-10-CM

## 2020-12-08 DIAGNOSIS — I10 Essential (primary) hypertension: Secondary | ICD-10-CM | POA: Diagnosis not present

## 2020-12-08 DIAGNOSIS — M25511 Pain in right shoulder: Secondary | ICD-10-CM | POA: Diagnosis not present

## 2020-12-08 DIAGNOSIS — Y92410 Unspecified street and highway as the place of occurrence of the external cause: Secondary | ICD-10-CM | POA: Diagnosis not present

## 2020-12-08 DIAGNOSIS — R519 Headache, unspecified: Secondary | ICD-10-CM | POA: Insufficient documentation

## 2020-12-08 DIAGNOSIS — F17210 Nicotine dependence, cigarettes, uncomplicated: Secondary | ICD-10-CM | POA: Diagnosis not present

## 2020-12-08 DIAGNOSIS — M542 Cervicalgia: Secondary | ICD-10-CM | POA: Diagnosis not present

## 2020-12-08 MED ORDER — OXYCODONE HCL 5 MG PO TABS
5.0000 mg | ORAL_TABLET | Freq: Once | ORAL | Status: DC
Start: 2020-12-08 — End: 2020-12-08
  Filled 2020-12-08: qty 1

## 2020-12-08 MED ORDER — ACETAMINOPHEN 500 MG PO TABS
1000.0000 mg | ORAL_TABLET | Freq: Once | ORAL | Status: AC
  Administered 2020-12-08: 1000 mg via ORAL
  Filled 2020-12-08: qty 2

## 2020-12-08 MED ORDER — LIDOCAINE 5 % EX PTCH
1.0000 | MEDICATED_PATCH | CUTANEOUS | Status: DC
  Administered 2020-12-08: 1 via TRANSDERMAL
  Filled 2020-12-08: qty 1
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
## 2020-12-08 NOTE — Discharge Instructions (Addendum)
IMPRESSION: No acute lumbar spine fracture.   Multilevel degenerative disc disease and facet arthropathy, worst from L3 through S1, resulting in varying degrees of mild to moderate spinal canal and neural foraminal narrowing. Take Tylenol 1 g every 6-8 hours to help with your pain.  Return to the ER for development of abdominal pain, weakness or any other concern    IMPRESSION: No acute intracranial abnormality seen.   Moderate multilevel degenerative disc disease noted in the cervical spine. No acute abnormality is noted.

## 2021-06-22 ENCOUNTER — Ambulatory Visit: Payer: Self-pay | Admitting: *Deleted

## 2021-06-22 NOTE — Telephone Encounter (Signed)
2nd attempt to contact patient. No answer, LVMTCB. ?

## 2021-06-22 NOTE — Telephone Encounter (Signed)
Summary: lower back pain  ? Pt's significant other called in for assistance. She said that pt has had a kidney stone before, she feels that pt could be experiencing another one. Pt is experiencing pain in his lower back on the right side.  ? ? ?Please assist pt further.  ? ? ? ?[phone]   ?  ? ? ?Called patient to review sx of low back pain. No answer, LVMTCB. [phone]. ?

## 2021-06-22 NOTE — Telephone Encounter (Signed)
3rd attempt to contact patient no answer. Left message to call back at [phone] and to go to UC/ED for evaluation if symptoms worsen.  ? ?
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
## 2021-08-11 ENCOUNTER — Ambulatory Visit: Payer: 59 | Admitting: Family Medicine

## 2021-08-11 ENCOUNTER — Encounter: Payer: Self-pay | Admitting: Family Medicine

## 2021-08-11 VITALS — BP 154/99 | HR 70 | Temp 98.3°F | Resp 18 | Ht 68.75 in | Wt 165.0 lb

## 2021-08-11 DIAGNOSIS — R030 Elevated blood-pressure reading, without diagnosis of hypertension: Secondary | ICD-10-CM | POA: Diagnosis not present

## 2021-08-11 DIAGNOSIS — M5442 Lumbago with sciatica, left side: Secondary | ICD-10-CM | POA: Diagnosis not present

## 2021-08-11 DIAGNOSIS — G8929 Other chronic pain: Secondary | ICD-10-CM | POA: Insufficient documentation

## 2021-08-11 DIAGNOSIS — F1021 Alcohol dependence, in remission: Secondary | ICD-10-CM | POA: Insufficient documentation

## 2021-08-11 MED ORDER — CYCLOBENZAPRINE HCL 10 MG PO TABS: 10.0000 mg | ORAL_TABLET | Freq: Every day | ORAL | 0 refills | Status: DC

## 2021-08-11 MED ORDER — MELOXICAM 15 MG PO TABS: 15.0000 mg | ORAL_TABLET | Freq: Every day | ORAL | 0 refills | Status: DC

## 2021-08-11 NOTE — Assessment & Plan Note (Signed)
Chronic, unstable Not on medication Previous without PCP Will re-evaluate in 1 month, at follow up Plan to start Hyzaar 100-25 at that time if remains elevated Continue to encourage smoking cessation when desired

## 2021-08-11 NOTE — Assessment & Plan Note (Signed)
Chronic, stable Reports that he has not had a drink in 8 years, 2015(?)
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
## 2021-09-05 NOTE — Progress Notes (Signed)
Established patient visit   Patient: Jonathon Hunt   DOB: [birth year]   56 y.o. Male  MRN: [id number] Visit Date: 09/09/2021  Today's healthcare provider: Gwyneth Sprout, FNP  Introduced to nurse practitioner role and practice setting.  All questions answered.  Discussed provider/patient relationship and expectations.   I,Tiffany J Bragg,acting as a scribe for Gwyneth Sprout, FNP.,have documented all relevant documentation on the behalf of Gwyneth Sprout, FNP,as directed by  Gwyneth Sprout, FNP while in the presence of Gwyneth Sprout, FNP.   Chief Complaint  Patient presents with   Hypertension   Subjective    HPI  Hypertension, follow-up  BP Readings from Last 3 Encounters:  09/09/21 (!) 144/99  08/11/21 (!) 154/99  12/08/20 (!) 129/98   Wt Readings from Last 3 Encounters:  09/09/21 165 lb 11.2 oz (75.2 kg)  08/11/21 165 lb (74.8 kg)  12/08/20 170 lb (77.1 kg)     He was last seen for hypertension 1 months ago.  BP at that visit was 154/99. Management since that visit includes continue medications.  He reports excellent compliance with treatment. He is not having side effects.  He is following a Regular diet. He is exercising. He does smoke.  Use of agents associated with hypertension: none.   Outside blood pressures are not checked. Symptoms: No chest pain No chest pressure  No palpitations No syncope  No dyspnea No orthopnea  No paroxysmal nocturnal dyspnea No lower extremity edema   Pertinent labs Lab Results  Component Value Date   CHOL 156 07/27/2014   HDL 46 07/27/2014   LDLCALC 97 07/27/2014   TRIG 64 07/27/2014   CHOLHDL 3.4 07/27/2014   Lab Results  Component Value Date   NA 137 11/30/2020   K 3.6 11/30/2020   CREATININE 0.78 11/30/2020   GFRNONAA >60 11/30/2020   GLUCOSE 97 11/30/2020   TSH 1.261 07/27/2014     The ASCVD Risk score (Arnett DK, et al., 2019) failed to calculate for the following reasons:   Cannot find a previous HDL  lab   Cannot find a previous total cholesterol lab  ---------------------------------------------------------------------------------------------------   Medications: Outpatient Medications Prior to Visit  Medication Sig   cyclobenzaprine (FLEXERIL) 10 MG tablet Take 1 tablet (10 mg total) by mouth at bedtime.   meloxicam (MOBIC) 15 MG tablet Take 1 tablet (15 mg total) by mouth daily.   No facility-administered medications prior to visit.    Review of Systems     Objective    BP (!) 144/99 (BP Location: Right Arm)   Pulse 94   Temp 98.3 F (36.8 C) (Oral)   Resp 16   Ht '5\' 8"'$  (1.727 m)   Wt 165 lb 11.2 oz (75.2 kg)   SpO2 96%   BMI 25.19 kg/m    Physical Exam Vitals and nursing note reviewed.  Constitutional:      Appearance: Normal appearance. He is normal weight.  HENT:     Head: Normocephalic and atraumatic.  Eyes:     Pupils: Pupils are equal, round, and reactive to light.  Cardiovascular:     Rate and Rhythm: Normal rate and regular rhythm.     Pulses: Normal pulses.     Heart sounds: Normal heart sounds.  Pulmonary:     Effort: Pulmonary effort is normal.     Breath sounds: Normal breath sounds.  Musculoskeletal:        General: Tenderness present. Normal range of  motion.     Cervical back: Normal range of motion.     Comments: Chronic back pain remains; continue use of Mobic and stretching/walking to support Refused additional f/u at this time with PT or Ortho  Skin:    General: Skin is warm and dry.     Capillary Refill: Capillary refill takes less than 2 seconds.  Neurological:     General: No focal deficit present.     Mental Status: He is alert and oriented to person, place, and time. Mental status is at baseline.  Psychiatric:        Mood and Affect: Mood normal.        Behavior: Behavior normal.        Thought Content: Thought content normal.        Judgment: Judgment normal.       No results found for any visits on 09/09/21.   Assessment & Plan     Problem List Items Addressed This Visit       Cardiovascular and Mediastinum   Primary hypertension - Primary    New dx Chronic, elevated Denies CP Denies SOB/ DOE Denies low blood pressure/hypotension Denies vision changes No LE Edema noted on exam Start Hyzaar 100-25 qd; RTC in 6 weeks for f/u Seek emergent care if you develop chest pain or chest pressure       Relevant Medications   losartan-hydrochlorothiazide (HYZAAR) 100-25 MG tablet     Nervous and Auditory   Chronic left-sided low back pain with left-sided sciatica    Chronic, stable Has tried some Mobic; however, continues to prefer Goody's powder Does not wish to be seen by ortho or PT at this time No mobility limitations to note        Return in about 6 weeks (around 10/21/2021) for HTN management.      Vonna Kotyk, FNP, have reviewed all documentation for this visit. The documentation on 09/09/21 for the exam, diagnosis, procedures, and orders are all accurate and complete.    Gwyneth Sprout, [place] [phone] (phone) [phone] (fax)  [place]

## 2021-09-09 ENCOUNTER — Ambulatory Visit: Payer: 59 | Admitting: Family Medicine

## 2021-09-09 ENCOUNTER — Encounter: Payer: Self-pay | Admitting: Family Medicine

## 2021-09-09 VITALS — BP 144/99 | HR 94 | Temp 98.3°F | Resp 16 | Ht 68.0 in | Wt 165.7 lb

## 2021-09-09 DIAGNOSIS — I10 Essential (primary) hypertension: Secondary | ICD-10-CM | POA: Diagnosis not present

## 2021-09-09 DIAGNOSIS — G8929 Other chronic pain: Secondary | ICD-10-CM

## 2021-09-09 DIAGNOSIS — M5442 Lumbago with sciatica, left side: Secondary | ICD-10-CM | POA: Diagnosis not present

## 2021-09-09 MED ORDER — LOSARTAN POTASSIUM-HCTZ 100-25 MG PO TABS: 1.0000 | ORAL_TABLET | Freq: Every day | ORAL | 1 refills | Status: DC

## 2021-09-09 NOTE — Assessment & Plan Note (Signed)
Chronic, stable Has tried some Mobic; however, continues to prefer Goody's powder Does not wish to be seen by ortho or PT at this time No mobility limitations to note

## 2021-09-09 NOTE — Assessment & Plan Note (Signed)
New dx Chronic, elevated Denies CP Denies SOB/ DOE Denies low blood pressure/hypotension Denies vision changes No LE Edema noted on exam Start Hyzaar 100-25 qd; RTC in 6 weeks for f/u Seek emergent care if you develop chest pain or chest pressure

## 2021-10-19 ENCOUNTER — Other Ambulatory Visit: Payer: Self-pay | Admitting: Family Medicine

## 2021-10-19 DIAGNOSIS — G8929 Other chronic pain: Secondary | ICD-10-CM

## 2021-10-20 NOTE — Telephone Encounter (Signed)
Requested medication (s) are due for refill today: Yes  Requested medication (s) are on the active medication list: Yes  Last refill:  08/11/21  Future visit scheduled: Yes  Notes to clinic:  See request.    Requested Prescriptions  Pending Prescriptions Disp Refills   cyclobenzaprine (FLEXERIL) 10 MG tablet [Pharmacy Med Name: CYCLOBENZAPRINE 10 MG TABLET] 30 tablet 0    Sig: TAKE 1 TABLET BY MOUTH EVERYDAY AT BEDTIME     Not Delegated - Analgesics:  Muscle Relaxants Failed - 10/19/2021  4:42 PM      Failed - This refill cannot be delegated      Passed - Valid encounter within last 6 months    Recent Outpatient Visits           1 month ago Primary hypertension   [facility] Tally Joe T, FNP   2 months ago Chronic left-sided low back pain with left-sided sciatica   [facility] Tally Joe T, FNP   7 years ago 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

## 2021-10-28 ENCOUNTER — Ambulatory Visit: Payer: 59 | Admitting: Family Medicine

## 2022-01-31 ENCOUNTER — Telehealth: Payer: Self-pay

## 2022-01-31 DIAGNOSIS — Z1211 Encounter for screening for malignant neoplasm of colon: Secondary | ICD-10-CM

## 2022-01-31 NOTE — Telephone Encounter (Unsigned)
Copied from [place] [phone]. Topic: General - Other >> Jan 31, 2022  3:54 PM [id number] J wrote: Reason for CRM: pt called in for assistance. Pt says that he would like to have a colonoscopy set up. Please assist pt further.

## 2022-02-01 NOTE — Telephone Encounter (Signed)
Left voicemail advising of referral.

## 2022-02-02 ENCOUNTER — Other Ambulatory Visit: Payer: Self-pay | Admitting: *Deleted

## 2022-02-02 ENCOUNTER — Telehealth: Payer: Self-pay | Admitting: *Deleted

## 2022-02-02 DIAGNOSIS — Z1211 Encounter for screening for malignant neoplasm of colon: Secondary | ICD-10-CM

## 2022-02-02 MED ORDER — NA SULFATE-K SULFATE-MG SULF 17.5-3.13-1.6 GM/177ML PO SOLN: 1.0000 | Freq: Once | ORAL | 0 refills | Status: AC

## 2022-02-02 NOTE — Telephone Encounter (Signed)
Gastroenterology Pre-Procedure Review  Request Date: 03/14/2022 Requesting Physician: Dr. Vicente Males  PATIENT REVIEW QUESTIONS: The patient responded to the following health history questions as indicated:    1. Are you having any GI issues? no 2. Do you have a personal history of Polyps? no 3. Do you have a family history of Colon Cancer or Polyps? no 4. Diabetes Mellitus? no 5. Joint replacements in the past 12 months?no 6. Major health problems in the past 3 months?no 7. Any artificial heart valves, MVP, or defibrillator?no    MEDICATIONS & ALLERGIES:    Patient reports the following regarding taking any anticoagulation/antiplatelet therapy:   Plavix, Coumadin, Eliquis, Xarelto, Lovenox, Pradaxa, Brilinta, or Effient? no Aspirin? no  Patient confirms/reports the following medications:  Current Outpatient Medications  Medication Sig Dispense Refill   cyclobenzaprine (FLEXERIL) 10 MG tablet TAKE 1 TABLET BY MOUTH EVERYDAY AT BEDTIME 30 tablet 0   losartan-hydrochlorothiazide (HYZAAR) 100-25 MG tablet Take 1 tablet by mouth daily. 90 tablet 1   meloxicam (MOBIC) 15 MG tablet Take 1 tablet (15 mg total) by mouth daily. 30 tablet 0   No current facility-administered medications for this visit.    Patient confirms/reports the following allergies:  Allergies  Allergen Reactions   Pine Hives    No orders of the defined types were placed in this encounter.   AUTHORIZATION INFORMATION Primary Insurance: 1D#: Group #:  Secondary Insurance: 1D#: Group #:  SCHEDULE INFORMATION: Date: 03/14/2022 Time: Location: [facility]

## 2022-02-07 ENCOUNTER — Other Ambulatory Visit: Payer: Self-pay

## 2022-02-07 ENCOUNTER — Telehealth: Payer: Self-pay | Admitting: Family Medicine

## 2022-02-07 MED ORDER — PEG 3350-KCL-NA BICARB-NACL 420 G PO SOLR: ORAL | 0 refills | Status: DC

## 2022-02-07 NOTE — Telephone Encounter (Signed)
A user error has taken place: encounter opened in error, closed for administrative reasons.

## 2022-03-03 ENCOUNTER — Ambulatory Visit: Payer: Self-pay

## 2022-03-03 NOTE — Telephone Encounter (Signed)
Message from Roslynn Amble sent at 03/03/2022  3:21 PM EST  Summary: Cannot tolerate medicine   The patient called in stating he cannot tolerate the medication prescribed. He is wanting to know if he can either get a lower dosage or something else. Please assist patient further.           Pt asking if Hyzaar dosage can be lowered or changed to another med. Pt stopped Hyzaar 1 week ago.  Pt was having lightheadedness and blurred vision. Pt felt off balance. Stopped med 1 week ago and symptoms went away. Denies any vision changes, lightheadedness, numbness or weakness to face, arms or legs. Advised pt will forward to provider. Reason for Disposition  [1] Caller has NON-URGENT medicine question about med that PCP prescribed AND [2] triager unable to answer question  Answer Assessment - Initial Assessment Questions 1. NAME of MEDICINE: "What medicine(s) are you calling about?"     Hyzaar  2. QUESTION: "What is your question?" (e.g., double dose of medicine, side effect)     Can medication be changed or decreased? 3. PRESCRIBER: "Who prescribed the medicine?" Reason: if prescribed by specialist, call should be referred to that group.     PCP 4. SYMPTOMS: "Do you have any symptoms?" If Yes, ask: "What symptoms are you having?"  "How bad are the symptoms (e.g., mild, moderate, severe)     Pt was having lightheadedness and blurred vision. Pt felt off balance. Stopped med 1 week ago and symptoms went away. Denies any vision changes, lightheadedness, numbness or weakness to face, arms or legs. 5. PREGNANCY:  "Is there any chance that you are pregnant?" "When was your last menstrual period?"     N/a  Protocols used: Medication Question Call-A-AH

## 2022-03-14 ENCOUNTER — Other Ambulatory Visit: Payer: Self-pay

## 2022-03-14 ENCOUNTER — Encounter: Payer: Self-pay | Admitting: Gastroenterology

## 2022-03-14 ENCOUNTER — Ambulatory Visit: Payer: 59 | Admitting: Anesthesiology

## 2022-03-14 ENCOUNTER — Ambulatory Visit
Admission: RE | Admit: 2022-03-14 | Discharge: 2022-03-14 | Disposition: A | Discharge status: Home / Self Care | Payer: 59 | Attending: Gastroenterology | Admitting: Gastroenterology

## 2022-03-14 ENCOUNTER — Encounter
Admission: RE | Disposition: A | Discharge status: Home / Self Care | Payer: Self-pay | Source: Home / Self Care | Attending: Gastroenterology

## 2022-03-14 DIAGNOSIS — I10 Essential (primary) hypertension: Secondary | ICD-10-CM | POA: Insufficient documentation

## 2022-03-14 DIAGNOSIS — Z1211 Encounter for screening for malignant neoplasm of colon: Secondary | ICD-10-CM | POA: Insufficient documentation

## 2022-03-14 DIAGNOSIS — K635 Polyp of colon: Secondary | ICD-10-CM | POA: Diagnosis not present

## 2022-03-14 DIAGNOSIS — D126 Benign neoplasm of colon, unspecified: Secondary | ICD-10-CM | POA: Diagnosis not present

## 2022-03-14 DIAGNOSIS — D125 Benign neoplasm of sigmoid colon: Secondary | ICD-10-CM | POA: Diagnosis not present

## 2022-03-14 DIAGNOSIS — F17210 Nicotine dependence, cigarettes, uncomplicated: Secondary | ICD-10-CM | POA: Insufficient documentation

## 2022-03-14 HISTORY — PX: COLONOSCOPY WITH PROPOFOL: SHX5780

## 2022-03-14 SURGERY — COLONOSCOPY WITH PROPOFOL
Anesthesia: General

## 2022-03-14 MED ORDER — PROPOFOL 500 MG/50ML IV EMUL
INTRAVENOUS | Status: DC | PRN
  Administered 2022-03-14: 165 ug/kg/min via INTRAVENOUS

## 2022-03-14 MED ORDER — PROPOFOL 1000 MG/100ML IV EMUL
INTRAVENOUS | Status: AC
  Filled 2022-03-14: qty 500

## 2022-03-14 MED ORDER — LIDOCAINE HCL (CARDIAC) PF 100 MG/5ML IV SOSY
PREFILLED_SYRINGE | INTRAVENOUS | Status: DC | PRN
  Administered 2022-03-14: 100 mg via INTRAVENOUS

## 2022-03-14 MED ORDER — STERILE WATER FOR IRRIGATION IR SOLN
Status: DC | PRN
  Administered 2022-03-14: 180 mL

## 2022-03-14 MED ORDER — SODIUM CHLORIDE 0.9 % IV SOLN: INTRAVENOUS | Status: DC

## 2022-03-14 MED ORDER — PROPOFOL 10 MG/ML IV BOLUS
INTRAVENOUS | Status: DC | PRN
  Administered 2022-03-14: 30 mg via INTRAVENOUS
  Administered 2022-03-14: 60 mg via INTRAVENOUS

## 2022-03-14 MED ORDER — MIDAZOLAM HCL 2 MG/2ML IJ SOLN
INTRAMUSCULAR | Status: AC
  Filled 2022-03-14: qty 2

## 2022-03-14 NOTE — Anesthesia Preprocedure Evaluation (Addendum)
Anesthesia Evaluation  Patient identified by MRN, date of birth, ID band Patient awake    Reviewed: Allergy & Precautions, NPO status , Patient's Chart, lab work & pertinent test results  Airway Mallampati: III  TM Distance: >3 FB Neck ROM: full    Dental  (+) Chipped, Poor Dentition, Missing, Dental Advidsory Given   Pulmonary Current Smoker   Pulmonary exam normal        Cardiovascular hypertension, Normal cardiovascular exam     Neuro/Psych negative neurological ROS  negative psych ROS   GI/Hepatic negative GI ROS, Neg liver ROS,neg GERD  ,,  Endo/Other  negative endocrine ROS    Renal/GU negative Renal ROS  negative genitourinary   Musculoskeletal   Abdominal   Peds  Hematology negative hematology ROS (+)   Anesthesia Other Findings Past Medical History: No date: Direct hernia No date: EtOH dependence ([place]) No date: H/O ETOH abuse No date: Hypertension No date: Pneumonia  Past Surgical History: 2011: LUNG SURGERY     Comment:  put a tube in to drain fluid out of lungs from pneumonia  BMI    Body Mass Index: 24.40 kg/m      Reproductive/Obstetrics negative OB ROS                             Anesthesia Physical Anesthesia Plan  ASA: 2  Anesthesia Plan: General   Post-op Pain Management:    Induction: Intravenous  PONV Risk Score and Plan: Propofol infusion and TIVA  Airway Management Planned: Natural Airway and Nasal Cannula  Additional Equipment:   Intra-op Plan:   Post-operative Plan:   Informed Consent: I have reviewed the patients History and Physical, chart, labs and discussed the procedure including the risks, benefits and alternatives for the proposed anesthesia with the patient or authorized representative who has indicated his/her understanding and acceptance.     Dental Advisory Given  Plan Discussed with: Anesthesiologist, CRNA and  Surgeon  Anesthesia Plan Comments: (Patient consented for risks of anesthesia including but not limited to:  - adverse reactions to medications - risk of airway placement if required - damage to eyes, teeth, lips or other oral mucosa - nerve damage due to positioning  - sore throat or hoarseness - Damage to heart, brain, nerves, lungs, other parts of body or loss of life  Patient voiced understanding.)       Anesthesia Quick Evaluation

## 2022-03-14 NOTE — Op Note (Signed)
[facility] Gastroenterology Patient Name: Jonathon Hunt Procedure Date: 03/14/2022 7:22 AM MRN: [id number] Account #: [id number] Date of Birth: 1965-10-22 Admit Type: Outpatient Age: 56 Room: [facility] ENDO ROOM 2 Gender: Male Note Status: Finalized Instrument Name: Jasper Riling [id number] Procedure:             Colonoscopy Indications:           Screening for colorectal malignant neoplasm Providers:             Jonathon Bellows MD, MD Referring MD:          Jonathon Bellows MD, MD (Referring MD) Medicines:             Monitored Anesthesia Care Complications:         No immediate complications. Procedure:             Pre-Anesthesia Assessment:                        - Prior to the procedure, a History and Physical was                         performed, and patient medications, allergies and                         sensitivities were reviewed. The patient's tolerance                         of previous anesthesia was reviewed.                        - The risks and benefits of the procedure and the                         sedation options and risks were discussed with the                         patient. All questions were answered and informed                         consent was obtained.                        - ASA Grade Assessment: II - A patient with mild                         systemic disease.                        After obtaining informed consent, the colonoscope was                         passed under direct vision. Throughout the procedure,                         the patient's blood pressure, pulse, and oxygen                         saturations were monitored continuously. The                         Colonoscope was  introduced through the anus and                         advanced to the the cecum, identified by the                         appendiceal orifice. The colonoscopy was performed                         with ease. The patient tolerated the procedure well.                          The quality of the bowel preparation was excellent.                         The ileocecal valve, appendiceal orifice, and rectum                         were photographed. Findings:      The perianal and digital rectal examinations were normal.      A 4 mm polyp was found in the descending colon. The polyp was sessile.       The polyp was removed with a cold snare. Resection was complete, but the       polyp tissue was not retrieved.      Three sessile polyps were found in the sigmoid colon. The polyps were 4       to 6 mm in size. These polyps were removed with a cold snare. Resection       and retrieval were complete.      A 15 mm polyp was found in the sigmoid colon. The polyp was sessile.       Preparations were made for mucosal resection. Demarcation of the lesion       was performed during the procedure, with blue light imaging to clearly       identify the boundaries of the lesion. Saline was injected to raise the       lesion. Snare mucosal resection was performed. Resection was complete,       and retrieval was complete. Resected tissue margins were examined and       clear of polyp tissue. To close a defect after mucosal resection, one       hemostatic clip was successfully placed. There was no bleeding at the       end of the procedure.      The exam was otherwise without abnormality on direct and retroflexion       views. Impression:            - One 4 mm polyp in the descending colon, removed with                         a cold snare. Complete resection. Polyp tissue not                         retrieved.                        - Three 4 to 6 mm polyps in the sigmoid colon, removed  with a cold snare. Resected and retrieved.                        - One 15 mm polyp in the sigmoid colon, removed with                         mucosal resection. Resected and retrieved. Clip was                         placed.                        -  The examination was otherwise normal on direct and                         retroflexion views.                        - Mucosal resection was performed. Resection was                         complete, and retrieval was complete. Recommendation:        - Discharge patient to home (with escort).                        - Resume previous diet.                        - Continue present medications.                        - Await pathology results.                        - Repeat colonoscopy in 3 years for surveillance. Procedure Code(s):     --- Professional ---                        [phone], Colonoscopy, flexible; with endoscopic mucosal                         resection                        [id number], 36, Colonoscopy, flexible; with removal of                         tumor(s), polyp(s), or other lesion(s) by snare                         technique Diagnosis Code(s):     --- Professional ---                        Z12.11, Encounter for screening for malignant neoplasm                         of colon                        D12.4, Benign neoplasm of descending colon  D12.5, Benign neoplasm of sigmoid colon CPT copyright 2022 American Medical Association. All rights reserved. The codes documented in this report are preliminary and upon coder review may  be revised to meet current compliance requirements. Jonathon Bellows, MD Jonathon Bellows MD, MD 03/14/2022 8:18:46 AM This report has been signed electronically. Number of Addenda: 0 Note Initiated On: 03/14/2022 7:22 AM Scope Withdrawal Time: 0 hours 16 minutes 44 seconds  Total Procedure Duration: 0 hours 19 minutes 35 seconds  Estimated Blood Loss:  Estimated blood loss: none.      [facility]

## 2022-03-14 NOTE — Anesthesia Postprocedure Evaluation (Signed)
Anesthesia Post Note  Patient: Jonathon Hunt  Procedure(s) Performed: COLONOSCOPY WITH PROPOFOL  Patient location during evaluation: Endoscopy Anesthesia Type: General Level of consciousness: awake and alert Pain management: pain level controlled Vital Signs Assessment: post-procedure vital signs reviewed and stable Respiratory status: spontaneous breathing, nonlabored ventilation and respiratory function stable Cardiovascular status: blood pressure returned to baseline and stable Postop Assessment: no apparent nausea or vomiting Anesthetic complications: no   No notable events documented.   Last Vitals:  Vitals:   03/14/22 0830 03/14/22 0840  BP: (!) 129/93 (!) 134/95  Pulse:    Resp:    Temp:    SpO2: 100% 100%    Last Pain:  Vitals:   03/14/22 0840  TempSrc:   PainSc: 0-No pain                 Alphonsus Sias

## 2022-03-14 NOTE — Anesthesia Procedure Notes (Addendum)
Procedure Name: General with mask airway Date/Time: 03/14/2022 7:54 AM  Performed by: Kelton Pillar, CRNAPre-anesthesia Checklist: Patient identified, Emergency Drugs available, Suction available and Patient being monitored Patient Re-evaluated:Patient Re-evaluated prior to induction Oxygen Delivery Method: Simple face mask Induction Type: IV induction Placement Confirmation: positive ETCO2, CO2 detector and breath sounds checked- equal and bilateral Dental Injury: Teeth and Oropharynx as per pre-operative assessment

## 2022-03-14 NOTE — H&P (Signed)
Jonathon Bellows, MD [address], [place], [place], [place], [address] [address], [place], [place], [place], [address] Phone: [phone]  Fax: [phone]  Primary Care Physician:  Gwyneth Sprout, FNP   Pre-Procedure History & Physical: HPI:  Jonathon Hunt is a 56 y.o. male is here for an colonoscopy.   Past Medical History:  Diagnosis Date   Direct hernia    EtOH dependence ([place])    H/O ETOH abuse    Hypertension    Pneumonia     Past Surgical History:  Procedure Laterality Date   LUNG SURGERY  2011   put a tube in to drain fluid out of lungs from pneumonia    Prior to Admission medications   Medication Sig Start Date End Date Taking? Authorizing Provider  losartan-hydrochlorothiazide (HYZAAR) 100-25 MG tablet Take 1 tablet by mouth daily. 09/09/21  Yes Gwyneth Sprout, FNP  meloxicam (MOBIC) 15 MG tablet Take 1 tablet (15 mg total) by mouth daily. 08/11/21  Yes Tally Joe T, FNP  cyclobenzaprine (FLEXERIL) 10 MG tablet TAKE 1 TABLET BY MOUTH EVERYDAY AT BEDTIME Patient not taking: Reported on 03/14/2022 10/20/21   Gwyneth Sprout, FNP  polyethylene glycol-electrolytes (GAVILYTE-N WITH FLAVOR PACK) 420 g solution Drink one 8 oz glass every 20 mins until you complete 1//2 of the container. Drink 2nd half of container 5 hours prior to procedure. Patient not taking: Reported on 03/14/2022 02/07/22   Jonathon Bellows, MD    Allergies as of 02/02/2022 - Review Complete 09/09/2021  Allergen Reaction Noted   Pine Hives 08/24/2012    Family History  Problem Relation Age of Onset   Hypertension Mother    Stroke Mother    Lung cancer Father     Social History   Socioeconomic History   Marital status: Widowed    Spouse name: Not on file   Number of children: Not on file   Years of education: Not on file   Highest education level: Not on file  Occupational History   Not on file  Tobacco Use   Smoking status: Every Day    Packs/day: 0.25    Years: 30.00     Total pack years: 7.50    Types: Cigarettes   Smokeless tobacco: Former  Scientific laboratory technician Use: Never used  Substance and Sexual Activity   Alcohol use: Never    Comment: one case per day   Drug use: No   Sexual activity: Never  Other Topics Concern   Not on file  Social History Narrative   Not on file   Social Determinants of Health   Financial Resource Strain: Not on file  Food Insecurity: Not on file  Transportation Needs: Not on file  Physical Activity: Not on file  Stress: Not on file  Social Connections: Not on file  Intimate Partner Violence: Not on file    Review of Systems: See HPI, otherwise negative ROS  Physical Exam: BP (!) 138/94   Pulse 72   Temp (!) 96.6 F (35.9 C) (Temporal)   Resp 18   Ht '5\' 9"'$  (1.753 m)   Wt 74.9 kg   SpO2 100%   BMI 24.40 kg/m  General:   Alert,  pleasant and cooperative in NAD Head:  Normocephalic and atraumatic. Neck:  Supple; no masses or thyromegaly. Lungs:  Clear throughout to auscultation, normal respiratory effort.    Heart:  +S1, +S2, Regular rate and rhythm, No edema. Abdomen:  Soft,  nontender and nondistended. Normal bowel sounds, without guarding, and without rebound.   Neurologic:  Alert and  oriented x4;  grossly normal neurologically.  Impression/Plan: Jonathon Hunt is here for an colonoscopy to be performed for Screening colonoscopy average risk   Risks, benefits, limitations, and alternatives regarding  colonoscopy have been reviewed with the patient.  Questions have been answered.  All parties agreeable.   Jonathon Bellows, MD  03/14/2022, 7:50 AM

## 2022-03-14 NOTE — Transfer of Care (Signed)
Immediate Anesthesia Transfer of Care Note  Patient: Jonathon Hunt  Procedure(s) Performed: COLONOSCOPY WITH PROPOFOL  Patient Location: Endoscopy Unit  Anesthesia Type:General  Level of Consciousness: drowsy and patient cooperative  Airway & Oxygen Therapy: Patient Spontanous Breathing and Patient connected to face mask oxygen  Post-op Assessment: Report given to RN and Post -op Vital signs reviewed and stable  Post vital signs: Reviewed and stable  Last Vitals:  Vitals Value Taken Time  BP 114/82 03/14/22 0820  Temp    Pulse 80 03/14/22 0823  Resp 16 03/14/22 0823  SpO2 100 % 03/14/22 0823  Vitals shown include unvalidated device data.  Last Pain:  Vitals:   03/14/22 0654  TempSrc: Temporal  PainSc: 0-No pain         Complications: No notable events documented.

## 2022-03-15 ENCOUNTER — Encounter: Payer: Self-pay | Admitting: Gastroenterology

## 2022-03-15 LAB — SURGICAL PATHOLOGY

## 2022-03-16 ENCOUNTER — Encounter: Payer: Self-pay | Admitting: Gastroenterology

## 2023-01-30 ENCOUNTER — Other Ambulatory Visit: Payer: Self-pay | Admitting: Family Medicine

## 2023-01-30 DIAGNOSIS — I10 Essential (primary) hypertension: Secondary | ICD-10-CM

## 2023-01-30 NOTE — Telephone Encounter (Signed)
Medication Refill - Medication: losartan-hydrochlorothiazide (HYZAAR) 100-25 MG tablet   Has the patient contacted their pharmacy? No Pt called directly in, I let him know in the future he can call the pharmacy  (Preferred Pharmacy (with phone number or street name): [id number] [place], [place] - [address] Phone: [phone]  Fax: [phone]    Has the patient been seen for an appointment in the last year OR does the patient have an upcoming appointment? yes  Agent: Please be advised that RX refills may take up to 3 business days. We ask that you follow-up with your pharmacy.

## 2023-01-31 NOTE — Telephone Encounter (Signed)
Requested medication (s) are due for refill today: yes  Requested medication (s) are on the active medication list:yes  Last refill:  09/09/21 #90 1 RF  Future visit scheduled: yes  Notes to clinic:  overdue labs    Requested Prescriptions  Pending Prescriptions Disp Refills   losartan-hydrochlorothiazide (HYZAAR) 100-25 MG tablet 90 tablet 1    Sig: Take 1 tablet by mouth daily.     Cardiovascular: ARB + Diuretic Combos Failed - 01/30/2023  4:24 PM      Failed - K in normal range and within 180 days    Potassium  Date Value Ref Range Status  11/30/2020 3.6 3.5 - 5.1 mmol/L Final         Failed - Na in normal range and within 180 days    Sodium  Date Value Ref Range Status  11/30/2020 137 135 - 145 mmol/L Final         Failed - Cr in normal range and within 180 days    Creat  Date Value Ref Range Status  07/27/2014 0.88 0.50 - 1.35 mg/dL Final   Creatinine, Ser  Date Value Ref Range Status  11/30/2020 0.78 0.61 - 1.24 mg/dL Final         Failed - eGFR is 10 or above and within 180 days    GFR, Est African American  Date Value Ref Range Status  07/27/2014 >89 mL/min Final   GFR, Est Non African American  Date Value Ref Range Status  07/27/2014 >89 mL/min Final    Comment:      The estimated GFR is a calculation valid for adults (>=78 years old) that uses the CKD-EPI algorithm to adjust for age and sex. It is   not to be used for children, pregnant women, hospitalized patients,    patients on dialysis, or with rapidly changing kidney function. According to the NKDEP, eGFR >89 is normal, 60-89 shows mild impairment, 30-59 shows moderate impairment, 15-29 shows severe impairment and <15 is ESRD.      GFR, Estimated  Date Value Ref Range Status  11/30/2020 >60 >60 mL/min Final    Comment:    (NOTE) Calculated using the CKD-EPI Creatinine Equation (2021)          Failed - Last BP in normal range    BP Readings from Last 1 Encounters:  03/14/22 (!) 134/95          Failed - Valid encounter within last 6 months    Recent Outpatient Visits           1 year ago Primary hypertension   [facility] Merita Norton T, FNP   1 year ago Chronic left-sided low back pain with left-sided sciatica   [facility] Merita Norton T, FNP   8 years ago Bilateral recurrent inguinal hernia without obstruction or gangrene   [facility] [place] [employer] E, MD   8 years ago Unilateral inguinal hernia with obstruction and without gangrene, recurrence not specified   [facility] [place]. [facility] Vivianne Master, [place]       Future Appointments             In 6 days Jacky Kindle, FNP [facility]            Passed - Patient is not pregnant

## 2023-02-06 ENCOUNTER — Ambulatory Visit: Payer: 59 | Admitting: Family Medicine

## 2023-02-06 ENCOUNTER — Encounter: Payer: Self-pay | Admitting: Family Medicine

## 2023-02-06 VITALS — BP 133/83 | HR 76 | Ht 69.0 in | Wt 164.0 lb

## 2023-02-06 DIAGNOSIS — K4020 Bilateral inguinal hernia, without obstruction or gangrene, not specified as recurrent: Secondary | ICD-10-CM | POA: Insufficient documentation

## 2023-02-06 DIAGNOSIS — Z114 Encounter for screening for human immunodeficiency virus [HIV]: Secondary | ICD-10-CM | POA: Diagnosis not present

## 2023-02-06 DIAGNOSIS — I10 Essential (primary) hypertension: Secondary | ICD-10-CM

## 2023-02-06 DIAGNOSIS — Z125 Encounter for screening for malignant neoplasm of prostate: Secondary | ICD-10-CM | POA: Diagnosis not present

## 2023-02-06 DIAGNOSIS — F1021 Alcohol dependence, in remission: Secondary | ICD-10-CM

## 2023-02-06 DIAGNOSIS — Z1159 Encounter for screening for other viral diseases: Secondary | ICD-10-CM | POA: Diagnosis not present

## 2023-02-06 DIAGNOSIS — Z131 Encounter for screening for diabetes mellitus: Secondary | ICD-10-CM

## 2023-02-06 DIAGNOSIS — F17200 Nicotine dependence, unspecified, uncomplicated: Secondary | ICD-10-CM

## 2023-02-06 DIAGNOSIS — K4091 Unilateral inguinal hernia, without obstruction or gangrene, recurrent: Secondary | ICD-10-CM | POA: Insufficient documentation

## 2023-02-06 MED ORDER — LOSARTAN POTASSIUM-HCTZ 100-25 MG PO TABS: 1.0000 | ORAL_TABLET | Freq: Every day | ORAL | 3 refills | Status: AC

## 2023-02-06 NOTE — Assessment & Plan Note (Signed)
Denies LUTS; recommend PSA in place of DRE. If PSA is elevated for age, we will repeat; if PSA remains elevated pt will be referred to urology for DRE and next steps for best treatment.  

## 2023-02-06 NOTE — Assessment & Plan Note (Signed)
Congratulated; 10 years sober

## 2023-02-06 NOTE — Assessment & Plan Note (Signed)
Low risk screen ?Consented; encouraged to "know your status" ?Recommend repeat screen if risk factors change ? ?

## 2023-02-06 NOTE — Progress Notes (Signed)
Established patient visit   Patient: Jonathon Hunt   DOB: [birth year]   57 y.o. Male  MRN: [id number] Visit Date: 02/06/2023  Today's healthcare provider: Jacky Kindle, FNP  Re Introduced to nurse practitioner role and practice setting.  All questions answered.  Discussed provider/patient relationship and expectations.  Chief Complaint  Patient presents with   Follow-up   Subjective    HPI HPI   Pt stated--right lower abdominal hernia--ready to have surgery this time. Medication refill Last edited by Shelly Bombard, CMA on 02/06/2023  3:28 PM.      Medications: Outpatient Medications Prior to Visit  Medication Sig   [DISCONTINUED] cyclobenzaprine (FLEXERIL) 10 MG tablet TAKE 1 TABLET BY MOUTH EVERYDAY AT BEDTIME   [DISCONTINUED] losartan-hydrochlorothiazide (HYZAAR) 100-25 MG tablet Take 1 tablet by mouth daily.   [DISCONTINUED] meloxicam (MOBIC) 15 MG tablet Take 1 tablet (15 mg total) by mouth daily.   [DISCONTINUED] polyethylene glycol-electrolytes (GAVILYTE-N WITH FLAVOR PACK) 420 g solution Drink one 8 oz glass every 20 mins until you complete 1//2 of the container. Drink 2nd half of container 5 hours prior to procedure.   No facility-administered medications prior to visit.    Review of Systems     Objective    BP 133/83 (BP Location: Left Arm, Patient Position: Sitting, Cuff Size: Normal)   Pulse 76   Ht 5\' 9"  (1.753 m)   Wt 164 lb (74.4 kg)   SpO2 98%   BMI 24.22 kg/m    Physical Exam Vitals and nursing note reviewed.  Constitutional:      Appearance: Normal appearance. He is normal weight.  HENT:     Head: Normocephalic and atraumatic.  Cardiovascular:     Rate and Rhythm: Normal rate and regular rhythm.     Pulses: Normal pulses.     Heart sounds: Normal heart sounds.  Pulmonary:     Effort: Pulmonary effort is normal.     Breath sounds: Normal breath sounds.  Abdominal:     General: Bowel sounds are normal.     Palpations: Abdomen is  soft.     Hernia: A hernia is present.     Comments: RLQ hernia; soft, reducible   Musculoskeletal:        General: Normal range of motion.     Cervical back: Normal range of motion.  Skin:    General: Skin is warm and dry.     Capillary Refill: Capillary refill takes less than 2 seconds.  Neurological:     General: No focal deficit present.     Mental Status: He is alert and oriented to person, place, and time. Mental status is at baseline.  Psychiatric:        Mood and Affect: Mood normal.        Behavior: Behavior normal.        Thought Content: Thought content normal.        Judgment: Judgment normal.     No results found for any visits on 02/06/23.  Assessment & Plan     Problem List Items Addressed This Visit       Cardiovascular and Mediastinum   Primary hypertension - Primary    Chronic; stable Continue medication Repeat labs       Relevant Medications   losartan-hydrochlorothiazide (HYZAAR) 100-25 MG tablet   Other Relevant Orders   CBC with Differential/Platelet   Comprehensive Metabolic Panel (CMET)   TSH     Other   Alcoholism  in remission (HCC)    Congratulated; 10 years sober      Encounter for hepatitis C screening test for low risk patient    Low risk screen Treatable, and curable. If left untreated Hep C can lead to cirrhosis and liver failure. Encourage routine testing; recommend repeat testing if risk factors change.       Relevant Orders   Hepatitis C Antibody   Encounter for screening for HIV    Low risk screen Consented; encouraged to "know your status" Recommend repeat screen if risk factors change       Relevant Orders   HIV antibody (with reflex)   Encounter for screening prostate specific antigen (PSA) measurement    Denies LUTS; recommend PSA in place of DRE. If PSA is elevated for age, we will repeat; if PSA remains elevated pt will be referred to urology for DRE and next steps for best treatment.       Relevant Orders    PSA   Screening for diabetes mellitus    Check A1c; Continue to recommend balanced, lower carb meals. Smaller meal size, adding snacks. Choosing water as drink of choice and increasing purposeful exercise.       Relevant Orders   Hemoglobin A1c   Tobacco dependence    Chronic; remains pre-contemplative Declines assistance at this time  Advised cessation efforts may be needed for surgical repair      Relevant Orders   Lipid panel   Unilateral recurrent inguinal hernia without obstruction or gangrene    Chronic, worsening Soft; reducible Complaints worsen when changing from lying to standing d/t bending Wishes to complete surgical referral for 03/28/23 procedure      Relevant Orders   Ambulatory referral to General Surgery   No follow-ups on file.     Leilani Merl, FNP, have reviewed all documentation for this visit. The documentation on 02/06/23 for the exam, diagnosis, procedures, and orders are all accurate and complete.  Jacky Kindle, FNP  [facility] [phone] (phone) [phone] (fax)  [facility]

## 2023-02-06 NOTE — Assessment & Plan Note (Signed)
Check A1c Continue to recommend balanced, lower carb meals. Smaller meal size, adding snacks. Choosing water as drink of choice and increasing purposeful exercise.

## 2023-02-06 NOTE — Assessment & Plan Note (Signed)
Chronic; remains pre-contemplative Declines assistance at this time  Advised cessation efforts may be needed for surgical repair

## 2023-02-06 NOTE — Assessment & Plan Note (Signed)
Low risk screen Treatable, and curable. If left untreated Hep C can lead to cirrhosis and liver failure. Encourage routine testing; recommend repeat testing if risk factors change.  

## 2023-02-06 NOTE — Assessment & Plan Note (Signed)
Chronic; stable Continue medication Repeat labs

## 2023-02-06 NOTE — Assessment & Plan Note (Signed)
Chronic, worsening Soft; reducible Complaints worsen when changing from lying to standing d/t bending Wishes to complete surgical referral for 03/28/23 procedure

## 2023-02-07 ENCOUNTER — Other Ambulatory Visit: Payer: Self-pay | Admitting: Family Medicine

## 2023-02-07 DIAGNOSIS — R972 Elevated prostate specific antigen [PSA]: Secondary | ICD-10-CM

## 2023-02-07 LAB — HEMOGLOBIN A1C
Est. average glucose Bld gHb Est-mCnc: 120 mg/dL
Hgb A1c MFr Bld: 5.8 % — ABNORMAL HIGH (ref 4.8–5.6)

## 2023-02-07 LAB — HIV ANTIBODY (ROUTINE TESTING W REFLEX): HIV Screen 4th Generation wRfx: NONREACTIVE

## 2023-02-07 LAB — COMPREHENSIVE METABOLIC PANEL
ALT: 8 [IU]/L (ref 0–44)
AST: 13 [IU]/L (ref 0–40)
Albumin: 4.3 g/dL (ref 3.8–4.9)
Alkaline Phosphatase: 56 [IU]/L (ref 44–121)
BUN/Creatinine Ratio: 10 (ref 9–20)
BUN: 10 mg/dL (ref 6–24)
Bilirubin Total: 0.2 mg/dL (ref 0.0–1.2)
CO2: 24 mmol/L (ref 20–29)
Calcium: 9.6 mg/dL (ref 8.7–10.2)
Chloride: 106 mmol/L (ref 96–106)
Creatinine, Ser: 1.05 mg/dL (ref 0.76–1.27)
Globulin, Total: 2.3 g/dL (ref 1.5–4.5)
Glucose: 84 mg/dL (ref 70–99)
Potassium: 4.6 mmol/L (ref 3.5–5.2)
Sodium: 143 mmol/L (ref 134–144)
Total Protein: 6.6 g/dL (ref 6.0–8.5)
eGFR: 82 mL/min/{1.73_m2} (ref >59)

## 2023-02-07 LAB — CBC WITH DIFFERENTIAL/PLATELET
Basophils Absolute: 0.1 10*3/uL (ref 0.0–0.2)
Basos: 1 %
EOS (ABSOLUTE): 0.4 10*3/uL (ref 0.0–0.4)
Eos: 4 %
Hematocrit: 47.4 % (ref 37.5–51.0)
Hemoglobin: 15.4 g/dL (ref 13.0–17.7)
Immature Grans (Abs): 0 10*3/uL (ref 0.0–0.1)
Immature Granulocytes: 0 %
Lymphocytes Absolute: 2.7 10*3/uL (ref 0.7–3.1)
Lymphs: 32 %
MCH: 28.9 pg (ref 26.6–33.0)
MCHC: 32.5 g/dL (ref 31.5–35.7)
MCV: 89 fL (ref 79–97)
Monocytes Absolute: 0.6 10*3/uL (ref 0.1–0.9)
Monocytes: 7 %
Neutrophils Absolute: 4.8 10*3/uL (ref 1.4–7.0)
Neutrophils: 56 %
Platelets: 246 10*3/uL (ref 150–450)
RBC: 5.32 x10E6/uL (ref 4.14–5.80)
RDW: 13.5 % (ref 11.6–15.4)
WBC: 8.6 10*3/uL (ref 3.4–10.8)

## 2023-02-07 LAB — LIPID PANEL
Chol/HDL Ratio: 3.4 ratio (ref 0.0–5.0)
Cholesterol, Total: 162 mg/dL (ref 100–199)
HDL: 47 mg/dL (ref >39)
LDL Chol Calc (NIH): 100 mg/dL — ABNORMAL HIGH (ref 0–99)
Triglycerides: 80 mg/dL (ref 0–149)
VLDL Cholesterol Cal: 15 mg/dL (ref 5–40)

## 2023-02-07 LAB — PSA: Prostate Specific Ag, Serum: 21.2 ng/mL — ABNORMAL HIGH (ref 0.0–4.0)

## 2023-02-07 LAB — TSH: TSH: 1.43 u[IU]/mL (ref 0.450–4.500)

## 2023-02-07 LAB — HEPATITIS C ANTIBODY: Hep C Virus Ab: NONREACTIVE

## 2023-02-07 NOTE — Progress Notes (Signed)
Recommend repeat PSA in 1 week and referral to urology.  Prediabetes noted; Continue to recommend balanced, lower carb meals. Smaller meal size, adding snacks. Choosing water as drink of choice and increasing purposeful exercise.

## 2023-02-08 ENCOUNTER — Telehealth: Payer: Self-pay | Admitting: Family Medicine

## 2023-02-08 NOTE — Telephone Encounter (Signed)
Pt. Given lab results and instructions.Verbalizes understanding. 

## 2023-02-12 ENCOUNTER — Telehealth: Payer: Self-pay | Admitting: *Deleted

## 2023-02-12 NOTE — Telephone Encounter (Signed)
  Chief Complaint: Results Symptoms: NA Frequency: NA Pertinent Negatives: Patient denies NA Disposition: [] ED /[] Urgent Care (no appt availability in office) / [] Appointment(In office/virtual)/ []  [facility]/ [] Home Care/ [] Refused Recommended Disposition /[] [facility]/ []  Follow-up with PCP Additional Notes:    Pt calling for results, message from PCP read to pt, verbalizes understanding. States he will come in for repeat PSA tomorrow. Advised referral sent.

## 2023-02-14 LAB — PSA: Prostate Specific Ag, Serum: 22.8 ng/mL — ABNORMAL HIGH (ref 0.0–4.0)

## 2023-03-15 ENCOUNTER — Encounter: Payer: Self-pay | Admitting: Urology

## 2023-03-15 ENCOUNTER — Ambulatory Visit: Payer: 59 | Admitting: Urology

## 2023-03-15 ENCOUNTER — Ambulatory Visit: Payer: Self-pay | Admitting: *Deleted

## 2023-03-15 VITALS — BP 130/84 | HR 74 | Ht 69.0 in | Wt 164.0 lb

## 2023-03-15 DIAGNOSIS — R8290 Unspecified abnormal findings in urine: Secondary | ICD-10-CM | POA: Diagnosis not present

## 2023-03-15 DIAGNOSIS — R972 Elevated prostate specific antigen [PSA]: Secondary | ICD-10-CM

## 2023-03-15 DIAGNOSIS — M47816 Spondylosis without myelopathy or radiculopathy, lumbar region: Secondary | ICD-10-CM | POA: Insufficient documentation

## 2023-03-15 DIAGNOSIS — M1611 Unilateral primary osteoarthritis, right hip: Secondary | ICD-10-CM | POA: Insufficient documentation

## 2023-03-15 DIAGNOSIS — M533 Sacrococcygeal disorders, not elsewhere classified: Secondary | ICD-10-CM | POA: Insufficient documentation

## 2023-03-15 DIAGNOSIS — M5416 Radiculopathy, lumbar region: Secondary | ICD-10-CM | POA: Insufficient documentation

## 2023-03-15 LAB — MICROSCOPIC EXAMINATION

## 2023-03-15 LAB — URINALYSIS, COMPLETE
Bilirubin, UA: NEGATIVE
Glucose, UA: NEGATIVE
Ketones, UA: NEGATIVE
Nitrite, UA: NEGATIVE
Protein,UA: NEGATIVE
RBC, UA: NEGATIVE
Specific Gravity, UA: 1.015 (ref 1.005–1.030)
Urobilinogen, Ur: 0.2 mg/dL (ref 0.2–1.0)
pH, UA: 5.5 (ref 5.0–7.5)

## 2023-03-15 NOTE — Telephone Encounter (Signed)
  Chief Complaint: Leg pain Symptoms: Right leg pain, buttocks and hip right sided. 10/10 walking, relieved when lying flat. Frequency: 1 week Pertinent Negatives: Patient denies injury Disposition: [] ED /[x] Urgent Care (no appt availability in office) / [] Appointment(In office/virtual)/ []  [facility]/ [] Home Care/ [] Refused Recommended Disposition /[] [facility]/ []  Follow-up with PCP Additional Notes:  No availability, advised UC. States he has appt today with urologist and will speak with him. Advised UC if needed after appt. Care advise provided, pt verbalizes understanding. Reason for Disposition  [1] SEVERE pain (e.g., excruciating, unable to do any normal activities) AND [2] not improved after 2 hours of pain medicine  Answer Assessment - Initial Assessment Questions 1. ONSET: "When did the pain start?"      1 week 2. LOCATION: "Where is the pain located?"      Right leg, entire leg, From buttocks down leg, hip 3. PAIN: "How bad is the pain?"    (Scale 1-10; or mild, moderate, severe)   -  MILD (1-3): doesn't interfere with normal activities    -  MODERATE (4-7): interferes with normal activities (e.g., work or school) or awakens from sleep, limping    -  SEVERE (8-10): excruciating pain, unable to do any normal activities, unable to walk     10/10 4. WORK OR EXERCISE: "Has there been any recent work or exercise that involved this part of the body?"      No 5. CAUSE: "What do you think is causing the leg pain?"     Maybe arthritis. 6. OTHER SYMPTOMS: "Do you have any other symptoms?" (e.g., chest pain, back pain, breathing difficulty, swelling, rash, fever, numbness, weakness)     Hip pain  Protocols used: Leg Pain-A-AH

## 2023-03-15 NOTE — Progress Notes (Signed)
   Jonathon Hunt,acting as a scribe for Jonathon Altes, MD.,have documented all relevant documentation on the behalf of Jonathon Altes, MD,as directed by  Jonathon Altes, MD while in the presence of Jonathon Altes, MD.  03/15/23 1:36 PM   Jonathon Hunt Jonathon Hunt 19-May-1965 [id number]  Referring provider: Jacky Kindle, FNP [address] [place],  [place] [address]  Chief Complaint  Patient presents with   Elevated PSA   Establish Care    HPI: RALPHEAL SURGENER is a 57 y.o. male who is referred for an elevated PSA.   Patient thought he was being referred for a hernia PSA drawn 02/06/2023 was elevated at 21.2 and repeated 02/13/2023 and remained elevated at 22.8 Urinalysis was not checked No bothersome LUTS No family history of prostate cancer Denies previous urologic problems   PMH: Past Medical History:  Diagnosis Date   Direct hernia    EtOH dependence (HCC)    H/O ETOH abuse    Hypertension    Pneumonia     Surgical History: Past Surgical History:  Procedure Laterality Date   COLONOSCOPY WITH PROPOFOL N/A 03/14/2022   Procedure: COLONOSCOPY WITH PROPOFOL;  Surgeon: Wyline Mood, MD;  Location: [facility] ENDOSCOPY;  Service: Gastroenterology;  Laterality: N/A;   LUNG SURGERY  2011   put a tube in to drain fluid out of lungs from pneumonia    Home Medications:  Allergies as of 03/15/2023       Reactions   Pine Hives        Medication List        Accurate as of March 15, 2023  1:36 PM. If you have any questions, ask your nurse or doctor.          losartan-hydrochlorothiazide 100-25 MG tablet Commonly known as: HYZAAR Take 1 tablet by mouth daily.        Allergies:  Allergies  Allergen Reactions   Pine Hives    Family History: Family History  Problem Relation Age of Onset   Hypertension Mother    Stroke Mother    Lung cancer Father     Social History:  reports that he has been smoking cigarettes. He has a 7.5 pack-year smoking history.  He has quit using smokeless tobacco. He reports that he does not drink alcohol and does not use drugs.   Physical Exam: BP 130/84   Pulse 74   Ht 5\' 9"  (1.753 m)   Wt 164 lb (74.4 kg)   BMI 24.22 kg/m   Constitutional:  Alert and oriented, No acute distress. HEENT: [place] AT GU: Prostate 50 grams, slight increased firmness diffusely Psychiatric: Normal mood and affect.   Assessment & Plan:    1. Elevated PSA Urinalysis ordered We discussed PSA's >20 are suspicious for prostate cancer and recommend scheduled TRUS/biopsy of the prostate. The procedure was discussed in detail, including potential risks of bleeding and infection/sepsis. He will be notified with his UA results and if normal, he will be contacted for biopsy scheduling  I have reviewed the above documentation for accuracy and completeness, and I agree with the above.   Jonathon Altes, MD  [facility] [address] [place], [place] [address] [phone]

## 2023-03-19 LAB — CULTURE, URINE COMPREHENSIVE

## 2023-03-22 ENCOUNTER — Encounter (HOSPITAL_COMMUNITY): Payer: Self-pay

## 2023-03-22 ENCOUNTER — Ambulatory Visit (HOSPITAL_COMMUNITY)
Admission: EM | Admit: 2023-03-22 | Discharge: 2023-03-22 | Disposition: A | Discharge status: Home / Self Care | Payer: 59

## 2023-03-22 ENCOUNTER — Other Ambulatory Visit: Payer: Self-pay | Admitting: *Deleted

## 2023-03-22 DIAGNOSIS — M5431 Sciatica, right side: Secondary | ICD-10-CM | POA: Diagnosis not present

## 2023-03-22 MED ORDER — DOXYCYCLINE HYCLATE 100 MG PO TABS: 100.0000 mg | ORAL_TABLET | Freq: Two times a day (BID) | ORAL | 0 refills | Status: DC

## 2023-03-22 MED ORDER — KETOROLAC TROMETHAMINE 30 MG/ML IJ SOLN
INTRAMUSCULAR | Status: AC
  Filled 2023-03-22: qty 1

## 2023-03-22 MED ORDER — KETOROLAC TROMETHAMINE 30 MG/ML IJ SOLN
30.0000 mg | Freq: Once | INTRAMUSCULAR | Status: AC
  Administered 2023-03-22: 30 mg via INTRAMUSCULAR

## 2023-03-22 MED ORDER — TIZANIDINE HCL 4 MG PO TABS: 4.0000 mg | ORAL_TABLET | Freq: Three times a day (TID) | ORAL | 0 refills | Status: DC | PRN

## 2023-03-22 NOTE — Discharge Instructions (Addendum)
We have given you an injection of an anti-inflammatory medicine called Toradol today.  Please do not take any Motrin, Aleve, Advil, or ibuprofen for the next 48 hours.  You can take Tylenol 500 to 1000 mg every 6 hours for pain.  After 48 hours, recommend ibuprofen 4 to 600 mg every 8 hours for pain.  In addition, start the exercises I have attached to the information today to help rehabilitate your back.  You can take the muscle relaxant every 8 hours as needed for muscular pain.  Follow-up with PCP or sports medicine if symptoms do not improve with treatment.

## 2023-03-22 NOTE — ED Provider Notes (Signed)
[facility]    CSN: [id number] Arrival date & time: 03/22/23  1013      History   Chief Complaint Chief Complaint  Patient presents with   Leg Pain    HPI Jonathon Hunt is a 57 y.o. male.   Patient presents today with 3-week history of right sided low back and right leg pain.  Reports the pain initiates in his right low back and radiates down his right leg.  Denies recent fall, trauma, or known injury to the area.  Denies swelling, redness, or bruising to the area.  Reports he was seen initially by an alternate clinic who prescribed him a methylprednisolone Dosepak, reports symptoms have not changed since completing that.  He denies new urinary symptoms, hematuria, urinary or bowel incontinence.  He does endorse some tingling going down his leg when the pain radiates, however denies any numbness or decreased sensation of the right lower extremity.  Has been taking Tylenol and it seems to help minimally.    Past Medical History:  Diagnosis Date   Direct hernia    EtOH dependence (HCC)    H/O ETOH abuse    Hypertension    Pneumonia     Patient Active Problem List   Diagnosis Date Noted   Encounter for screening prostate specific antigen (PSA) measurement 02/06/2023   Encounter for screening for HIV 02/06/2023   Encounter for hepatitis C screening test for low risk patient 02/06/2023   Unilateral recurrent inguinal hernia without obstruction or gangrene 02/06/2023   Tobacco dependence 02/06/2023   Screening for diabetes mellitus 02/06/2023   Screening for colon cancer 03/14/2022   Adenomatous polyp of colon 03/14/2022   Primary hypertension 09/09/2021   Chronic left-sided low back pain with left-sided sciatica 08/11/2021   Alcoholism in remission (HCC) 08/11/2021    Past Surgical History:  Procedure Laterality Date   COLONOSCOPY WITH PROPOFOL N/A 03/14/2022   Procedure: COLONOSCOPY WITH PROPOFOL;  Surgeon: Wyline Mood, MD;  Location: [facility] ENDOSCOPY;   Service: Gastroenterology;  Laterality: N/A;   LUNG SURGERY  2011   put a tube in to drain fluid out of lungs from pneumonia       Home Medications    Prior to Admission medications   Medication Sig Start Date End Date Taking? Authorizing Provider  losartan-hydrochlorothiazide (HYZAAR) 100-25 MG tablet Take 1 tablet by mouth daily. 02/06/23  Yes Jacky Kindle, FNP  tiZANidine (ZANAFLEX) 4 MG tablet Take 1 tablet (4 mg total) by mouth every 8 (eight) hours as needed for muscle spasms. Do not take with alcohol or while driving or operating heavy machinery.  May cause drowsiness. 03/22/23  Yes Valentino Nose, NP    Family History Family History  Problem Relation Age of Onset   Hypertension Mother    Stroke Mother    Lung cancer Father     Social History Social History   Tobacco Use   Smoking status: Every Day    Current packs/day: 0.25    Average packs/day: 0.3 packs/day for 30.0 years (7.5 ttl pk-yrs)    Types: Cigarettes   Smokeless tobacco: Former  Building services engineer status: Never Used  Substance Use Topics   Alcohol use: Never    Comment: one case per day   Drug use: No     Allergies   Pine   Review of Systems Review of Systems Per HPI  Physical Exam Triage Vital Signs ED Triage Vitals  Encounter Vitals Group  BP 03/22/23 1033 124/89     Systolic BP Percentile --      Diastolic BP Percentile --      Pulse Rate 03/22/23 1033 (!) 111     Resp 03/22/23 1033 16     Temp 03/22/23 1033 97.7 F (36.5 C)     Temp Source 03/22/23 1033 Oral     SpO2 03/22/23 1033 94 %     Weight 03/22/23 1032 170 lb (77.1 kg)     Height 03/22/23 1032 5\' 9"  (1.753 m)     Head Circumference --      Peak Flow --      Pain Score 03/22/23 1031 10     Pain Loc --      Pain Education --      Exclude from Growth Chart --    No data found.  Updated Vital Signs BP 124/89 (BP Location: Left Arm)   Pulse (!) 111   Temp 97.7 F (36.5 C) (Oral)   Resp 16   Ht 5\' 9"   (1.753 m)   Wt 170 lb (77.1 kg)   SpO2 94%   BMI 25.10 kg/m   Visual Acuity Right Eye Distance:   Left Eye Distance:   Bilateral Distance:    Right Eye Near:   Left Eye Near:    Bilateral Near:     Physical Exam Vitals and nursing note reviewed.  Constitutional:      General: He is not in acute distress.    Appearance: Normal appearance. He is not toxic-appearing.  HENT:     Head: Normocephalic and atraumatic.     Mouth/Throat:     Mouth: Mucous membranes are moist.     Pharynx: Oropharynx is clear.  Pulmonary:     Effort: Pulmonary effort is normal. No respiratory distress.  Abdominal:     Tenderness: There is no right CVA tenderness or left CVA tenderness.  Musculoskeletal:     Right lower leg: No edema.     Left lower leg: No edema.       Legs:     Comments: Pain asdepicted  Inspection: no swelling, bruising, obvious deformity or redness to right low back Palpation: tender to palpation right low back and area marked; no obvious deformities palpated ROM: Full ROM to lumbar spine, bilateral lower extremities Strength: 5/5 bilateral lower extremities Neurovascular: neurovascularly intact in bilateral lower extremities  Skin:    General: Skin is warm and dry.     Capillary Refill: Capillary refill takes less than 2 seconds.     Coloration: Skin is not jaundiced or pale.     Findings: No erythema.  Neurological:     Mental Status: He is alert and oriented to person, place, and time.     Motor: No weakness.     Gait: Gait normal.  Psychiatric:        Behavior: Behavior is cooperative.      UC Treatments / Results  Labs (all labs ordered are listed, but only abnormal results are displayed) Labs Reviewed - No data to display  EKG   Radiology No results found.  Procedures Procedures (including critical care time)  Medications Ordered in UC Medications  ketorolac (TORADOL) 30 MG/ML injection 30 mg (30 mg Intramuscular Given 03/22/23 1115)    Initial  Impression / Assessment and Plan / UC Course  I have reviewed the triage vital signs and the nursing notes.  Pertinent labs & imaging results that were available during my care of the  patient were reviewed by me and considered in my medical decision making (see chart for details).   Patient is well-appearing, normotensive, afebrile, not tachypneic, oxygenating well on room air.  Patient is mildly tachycardic in triage, otherwise vital signs are stable.  1. Right sided sciatica No red flags Vitals and exam are reassuring Recent GFR normal, will treat with Toradol 30 mg IM in urgent care Start muscle relaxant, continue Tylenol After 48 hours, can take other NSAIDs Start sciatic rehab Recommend follow-up with PCP or sports medicine if symptoms do not improve with treatment  The patient was given the opportunity to ask questions.  All questions answered to their satisfaction.  The patient is in agreement to this plan.    Final Clinical Impressions(s) / UC Diagnoses   Final diagnoses:  Right sided sciatica     Discharge Instructions      We have given you an injection of an anti-inflammatory medicine called Toradol today.  Please do not take any Motrin, Aleve, Advil, or ibuprofen for the next 48 hours.  You can take Tylenol 500 to 1000 mg every 6 hours for pain.  After 48 hours, recommend ibuprofen 4 to 600 mg every 8 hours for pain.  In addition, start the exercises I have attached to the information today to help rehabilitate your back.  You can take the muscle relaxant every 8 hours as needed for muscular pain.  Follow-up with PCP or sports medicine if symptoms do not improve with treatment.    ED Prescriptions     Medication Sig Dispense Auth. Provider   tiZANidine (ZANAFLEX) 4 MG tablet Take 1 tablet (4 mg total) by mouth every 8 (eight) hours as needed for muscle spasms. Do not take with alcohol or while driving or operating heavy machinery.  May cause drowsiness. 30 tablet  Valentino Nose, NP      PDMP not reviewed this encounter.   Valentino Nose, NP 03/22/23 [id number]

## 2023-03-22 NOTE — ED Triage Notes (Addendum)
Right leg pain X3 Weeks. Patient thinks he is having an arthritis flare. Was given methylprednisolone does pack with no relief. Patient having stiffness in the leg as well.

## 2023-03-25 ENCOUNTER — Emergency Department (HOSPITAL_COMMUNITY)
Admission: EM | Admit: 2023-03-25 | Discharge: 2023-03-25 | Disposition: A | Discharge status: Home / Self Care | Payer: 59 | Attending: Emergency Medicine | Admitting: Emergency Medicine

## 2023-03-25 ENCOUNTER — Telehealth (HOSPITAL_COMMUNITY): Payer: Self-pay

## 2023-03-25 ENCOUNTER — Other Ambulatory Visit: Payer: Self-pay

## 2023-03-25 DIAGNOSIS — M5441 Lumbago with sciatica, right side: Secondary | ICD-10-CM | POA: Diagnosis not present

## 2023-03-25 DIAGNOSIS — Z79899 Other long term (current) drug therapy: Secondary | ICD-10-CM | POA: Diagnosis not present

## 2023-03-25 DIAGNOSIS — M545 Low back pain, unspecified: Secondary | ICD-10-CM | POA: Diagnosis present

## 2023-03-25 DIAGNOSIS — G8929 Other chronic pain: Secondary | ICD-10-CM | POA: Insufficient documentation

## 2023-03-25 MED ORDER — KETOROLAC TROMETHAMINE 60 MG/2ML IM SOLN
30.0000 mg | Freq: Once | INTRAMUSCULAR | Status: AC
  Administered 2023-03-25: 30 mg via INTRAMUSCULAR
  Filled 2023-03-25: qty 2

## 2023-03-25 NOTE — ED Notes (Signed)
 Pt d/c home per EDP order. Discharge summary reviewed, verbalize understanding. NAD. Ambulatory off unit.

## 2023-03-25 NOTE — ED Provider Notes (Signed)
Jonathon Hunt EMERGENCY DEPARTMENT AT [facility] Provider Note   CSN: [id number] Arrival date & time: 03/25/23  4098     History  Chief Complaint  Patient presents with   Leg Pain    Jonathon Hunt is a 57 y.o. male who presents with 3 weeks of right lower back pain that radiates down his right leg.  No injury or trauma.  No IV drug use.  No saddle anesthesia or urinary incontinence.  Had been seen at a clinic where x-rays were taken suggestive of lumbar arthritic changes.  Given a steroid Dosepak which had no improvement of symptoms.  Was seen at a urgent care recently for similar complaints, given Toradol with notable improvement.  Jonathon Hunt presents requesting similar treatment.   Leg Pain      Home Medications Prior to Admission medications   Medication Sig Start Date End Date Taking? Authorizing Provider  losartan-hydrochlorothiazide (HYZAAR) 100-25 MG tablet Take 1 tablet by mouth daily. 02/06/23   Jacky Kindle, FNP  tiZANidine (ZANAFLEX) 4 MG tablet Take 1 tablet (4 mg total) by mouth every 8 (eight) hours as needed for muscle spasms. Do not take with alcohol or while driving or operating heavy machinery.  May cause drowsiness. 03/22/23   Valentino Nose, NP      Allergies    Pine    Review of Systems   Review of Systems  Physical Exam Updated Vital Signs BP (!) 125/96   Pulse 81   Temp 98 F (36.7 C)   Resp 16   SpO2 100%  Physical Exam Vitals and nursing note reviewed.  Constitutional:      General: Jonathon Hunt is not in acute distress.    Appearance: Jonathon Hunt is well-developed.  HENT:     Head: Normocephalic and atraumatic.  Cardiovascular:     Rate and Rhythm: Normal rate and regular rhythm.     Heart sounds: No murmur heard. Pulmonary:     Effort: Pulmonary effort is normal. No respiratory distress.     Breath sounds: Normal breath sounds.  Musculoskeletal:        General: No swelling.     Cervical back: Neck supple.     Comments: No lumbar midline or  paraspinal tenderness.  Ambulates without difficulty.  Sensation intact, pulses symmetric, 5 out of 5 lower extremity strength  Skin:    General: Skin is warm and dry.     Capillary Refill: Capillary refill takes less than 2 seconds.  Neurological:     Mental Status: Jonathon Hunt is alert.  Psychiatric:        Mood and Affect: Mood normal.     ED Results / Procedures / Treatments   Labs (all labs ordered are listed, but only abnormal results are displayed) Labs Reviewed - No data to display  EKG None  Radiology No results found.  Procedures Procedures    Medications Ordered in ED Medications  ketorolac (TORADOL) injection 30 mg (has no administration in time range)    ED Course/ Medical Decision Making/ A&P                                 Medical Decision Making  This patient presents to the ED with chief complaint(s) of low back pain.  The complaint involves an extensive differential diagnosis and also carries with it a high risk of complications and morbidity.   pertinent past medical history as listed in  HPI  The differential diagnosis includes  Lumbar stenosis, strain, abscess, cauda equina  Additional history obtained: Records reviewed previous admission documents  Initial Assessment:   Patient is overall well-appearing, afebrile, nontoxic-appearing presenting with acute on chronic lumbar pain with radicular symptoms, no red flag symptoms..  Requesting dose of Toradol.  Appears that most recent kidney function was stable.  Do not feel that repeat imaging is warranted today.  Independent ECG interpretation:  none  Independent labs interpretation:  The following labs were independently interpreted:  none  Independent visualization and interpretation of imaging: none  Treatment and Reassessment: Patient given Toradol 30 mg IM  Consultations obtained:   none  Disposition:   Patient discharged home with Ortho referral, encouraged to use Tylenol and ibuprofen  needed for pain.  Social Determinants of Health:   none  This note was dictated with voice recognition software.  Despite best efforts at proofreading, errors may have occurred which can change the documentation meaning.          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

## 2023-03-25 NOTE — Discharge Instructions (Addendum)
It was a pleasure taking care of you today.  You were evaluated in the emergency room for low back pain.  You were given a dose of Toradol for pain.  You were provided a referral for orthopedics.  Please call make an appointment within the next week.  Please use Tylenol and ibuprofen as needed for pain.  But please avoid taking any additional medications today.  If you experience any new or worsening symptoms including loss of bowel or bladder control, fevers, chills worsening pain please return to the emergency room.

## 2023-03-25 NOTE — ED Triage Notes (Signed)
Pt. Stated, Ive had rt. Leg pain really bad for 3 weeks. I have arthritis but wht their given me for pain is not working.

## 2023-04-09 ENCOUNTER — Other Ambulatory Visit: Payer: Self-pay | Admitting: Urology

## 2023-04-20 ENCOUNTER — Other Ambulatory Visit: Payer: Self-pay | Admitting: *Deleted

## 2023-04-20 DIAGNOSIS — R972 Elevated prostate specific antigen [PSA]: Secondary | ICD-10-CM

## 2023-04-23 ENCOUNTER — Other Ambulatory Visit: Payer: 59

## 2023-04-23 ENCOUNTER — Encounter: Payer: Self-pay | Admitting: Urology

## 2023-04-25 ENCOUNTER — Ambulatory Visit: Payer: 59 | Admitting: Surgery

## 2023-04-25 ENCOUNTER — Encounter: Payer: Self-pay | Admitting: Surgery

## 2023-04-25 VITALS — BP 128/79 | HR 96 | Temp 98.0°F | Ht 69.0 in | Wt 156.0 lb

## 2023-04-25 DIAGNOSIS — R972 Elevated prostate specific antigen [PSA]: Secondary | ICD-10-CM | POA: Diagnosis not present

## 2023-04-25 DIAGNOSIS — K4020 Bilateral inguinal hernia, without obstruction or gangrene, not specified as recurrent: Secondary | ICD-10-CM | POA: Diagnosis not present

## 2023-04-25 NOTE — Patient Instructions (Addendum)
Call [facility] and reschedule your appointment with Dr Lonna [phone])  Call us to follow up after you have been cleared by Urology.   Groin Hernia (Inguinal Hernia) in Adults: What to Know  A hernia happens when an organ or tissue inside your body pushes out through a weak spot in the muscles of your belly. This makes a bulge. A groin hernia, or inguinal hernia, is found in the groin, which is the area where your leg meets your lower belly. If you're male, it may be in your scrotum. If you're male, it may be in the folds of skin around your vagina. There are three types: Reducible. This means the hernia can be pushed back into your belly. Incarcerated. This means it can't be pushed back into your belly. Strangulated. This means it can't be pushed back into your belly and blood flow is cut off to the tissues inside it. If you get this type of hernia, you'll need surgery right away. What are the causes? A groin hernia may happen when you strain your lower belly muscles, such as when you: Lift a heavy object. Strain to poop. Cough. You can be born with a groin hernia, or it can form over time. What increases the risk? You're more likely to get a groin hernia if: You're male. You're pregnant or have been pregnant. You're 50 years or older. You've had a groin hernia or belly surgery before. You smoke. You're overweight. You work at a job where you need to stand a lot or lift heavy things. What are the signs or symptoms? Symptoms may depend on how big the hernia is. If it's small, you may not have symptoms. If it's bigger, you may have: A bulge near your groin or genitals. Pain or burning in your groin. A dull ache or feeling of pressure in your groin. If blood flow is cut off to the tissues inside the hernia, you may also: Feel pain and tenderness when you touch the bulge. The skin over it may turn red or purple. Have a fever. Throw up or feel like you may throw  up. Have trouble pooping or passing gas. How is this diagnosed? You may be diagnosed based on your symptoms, medical history, and an exam. During the exam, you may be asked to cough or strain to force the bulge to stick out. Your health care provider may try to push the bulge back in. You may also need tests. These may include: An ultrasound. An MRI. A CT scan. How is this treated? Treatment depends on how big the hernia is and what symptoms you have. You may need: To be watched to see if the bulge grows bigger. Surgery. This may be done if the hernia is big or if you have symptoms. Follow these instructions at home: Lifestyle Ask if it's OK for you to lift. Try not to stand for long periods of time. Do not smoke, vape, or use nicotine or tobacco. Stay at a healthy weight. Try not to do things that put pressure on your hernia. Preventing trouble pooping You may need to take these steps to help prevent or treat trouble pooping (constipation): Take medicines to help you poop. Eat foods high in fiber, like beans, whole grains, and fresh fruits and vegetables. Drink more fluids as told. General instructions Try to push the hernia back in place by very gently pressing on it while lying down. Do not try to force it back in if it won't push in easily.  Watch your hernia for any changes in: Shape. Size. Color. Take your medicines only as told. Contact a health care provider if: You have a fever. You have new symptoms. Your symptoms get worse. You can't poop or pass gas. Get help right away if: Your bulge: Starts to hurt a lot. Changes color. You have sudden pain in your scrotum, or the size of your scrotum changes all of a sudden. You can't gently push the hernia back in place. You keep throwing up or feeling like you need to throw up. These symptoms may be an emergency. Call 911 right away. Do not wait to see if the symptoms will go away. Do not drive yourself to the  hospital. This information is not intended to replace advice given to you by your health care provider. Make sure you discuss any questions you have with your health care provider. Document Revised: 11/09/2022 Document Reviewed: 11/09/2022 Elsevier Patient Education  2024 [employer].

## 2023-04-25 NOTE — Progress Notes (Unsigned)
04/25/2023  Reason for Visit:  Bilateral inguinal hernia  Requesting Provider:  Merita Norton, FNP  History of Present Illness: Jonathon Hunt is a 58 y.o. male for evaluation of bilateral inguinal hernias.  The patient reports having hernias for about 5-6 years.  He initially was thinking about surgical management but he reports something else came up and he did not get that taken care of.  He had otherwise been without significant symptoms, but now feels more discomfort in the right groin, particularly when straining.  He denies any episodes of severe pain.  He is able to reduce both hernias himself with manual pressure. Denies any nausea, vomiting, constipation or diarrhea.    Of note, his recent labs showed a very elevated PSA of 21.2 confirmed on repeat test a week later of 22.8.  He was referred to Urology, and was seen by Dr. Lonna Cobb.  He recommended TRUS/biopsy but the patient has not followed up with him yet.  Past Medical History: Past Medical History:  Diagnosis Date   Direct hernia    EtOH dependence (HCC)    H/O ETOH abuse    Hypertension    Pneumonia      Past Surgical History: Past Surgical History:  Procedure Laterality Date   COLONOSCOPY WITH PROPOFOL N/A 03/14/2022   Procedure: COLONOSCOPY WITH PROPOFOL;  Surgeon: Wyline Mood, MD;  Location: [facility] ENDOSCOPY;  Service: Gastroenterology;  Laterality: N/A;   LUNG SURGERY  2011   put a tube in to drain fluid out of lungs from pneumonia    Home Medications: Prior to Admission medications   Medication Sig Start Date End Date Taking? Authorizing Provider  losartan-hydrochlorothiazide (HYZAAR) 100-25 MG tablet Take 1 tablet by mouth daily. 02/06/23  Yes Jacky Kindle, FNP    Allergies: Allergies  Allergen Reactions   Pine Hives    Social History:  reports that he has been smoking cigarettes. He has a 7.5 pack-year smoking history. He has been exposed to tobacco smoke. He has quit using smokeless tobacco. He reports  that he does not drink alcohol and does not use drugs.   Family History: Family History  Problem Relation Age of Onset   Hypertension Mother    Stroke Mother    Lung cancer Father     Review of Systems: Review of Systems  Constitutional:  Negative for chills and fever.  Respiratory:  Negative for shortness of breath.   Cardiovascular:  Negative for chest pain.  Gastrointestinal:  Positive for abdominal pain (in right groin when coughing). Negative for nausea and vomiting.    Physical Exam BP 128/79   Pulse 96   Temp 98 F (36.7 C)   Ht 5\' 9"  (1.753 m)   Wt 156 lb (70.8 kg)   SpO2 98%   BMI 23.04 kg/m  CONSTITUTIONAL: No acute distress HEENT:  Normocephalic, atraumatic, extraocular motion intact. RESPIRATORY:  Lungs are clear, and breath sounds are equal bilaterally. Normal respiratory effort without pathologic use of accessory muscles. CARDIOVASCULAR: Heart is regular without murmurs, gallops, or rubs. GI: The abdomen is soft, non-distended, non-tender.  Patient has bilateral inguinal hernias which are both reducible.  Right sider larger and containing bowel, left side smaller, likely containing fat.  MUSCULOSKELETAL:  Normal muscle strength and tone in all four extremities.  No peripheral edema or cyanosis. NEUROLOGIC:  Motor and sensation is grossly normal.  Cranial nerves are grossly intact. PSYCH:  Alert and oriented to person, place and time. Affect is normal.  Laboratory Analysis: Labs  from 02/06/2023: Sodium 143, potassium 4.6, chloride 106, CO2 24, BUN 10, creatinine 1.05.  LFTs within normal limits.  WBC 8.6, hemoglobin 15.4, hematocrit 47.4, platelets 246.  PSA 21.2  Imaging: CT abdomen/pelvis on 11/30/2020: IMPRESSION: 1. Unchanged fat containing left and bowel containing right inguinal hernias without evidence of obstruction or strangulation. 2. Stable findings of chronic pancreatitis. 3. Aortic Atherosclerosis (ICD10-I70.0).  Assessment and Plan: This is a  58 y.o. male with reducible bilateral inguinal hernias.  -Discussed with the patient the findings on his exam, showing bilateral reducible inguinal hernias.  The right side contains bowel, and the left side contains fat.  Discussed with him that these can be repaired, but I think the biggest priority is for him to get checked out for his very high PSA and get a biopsy.  If he does need prostatectomy, then this should be done first before we pursue any surgery.  Discussed with him that if we were to do minimally invasive surgery for his inguinal hernias, we would be adding significant scar tissue in the working field for a prostatectomy, so this should be done first instead.  We can always do an open inguinal hernia approach in the future. --The patient will follow up with Dr. Lonna Cobb and set up an appointment.  He will contact us after work up is completed so we can discuss surgical plans. --Return precautions given.  I spent 30 minutes dedicated to the care of this patient on the date of this encounter to include pre-visit review of records, face-to-face time with the patient discussing diagnosis and management, and any post-visit coordination of care.   Howie Ill, MD [facility]

## 2023-04-26 ENCOUNTER — Other Ambulatory Visit: Payer: 59

## 2023-04-26 DIAGNOSIS — R972 Elevated prostate specific antigen [PSA]: Secondary | ICD-10-CM

## 2023-04-27 LAB — PSA: Prostate Specific Ag, Serum: 19.1 ng/mL — ABNORMAL HIGH (ref 0.0–4.0)

## 2023-04-30 NOTE — Progress Notes (Signed)
Notified patient as instructed, patient scheduled for lab appt and trus/ biopsy . If we do the biopsy patient wants [place] to call him back and go iver instructions.

## 2023-04-30 NOTE — Progress Notes (Signed)
 Left message for patient to return the call.

## 2023-05-28 ENCOUNTER — Other Ambulatory Visit: Payer: Self-pay | Admitting: *Deleted

## 2023-05-28 DIAGNOSIS — R972 Elevated prostate specific antigen [PSA]: Secondary | ICD-10-CM

## 2023-05-28 DIAGNOSIS — R8290 Unspecified abnormal findings in urine: Secondary | ICD-10-CM

## 2023-05-30 ENCOUNTER — Other Ambulatory Visit: Payer: 59

## 2023-05-30 DIAGNOSIS — R8290 Unspecified abnormal findings in urine: Secondary | ICD-10-CM

## 2023-05-31 LAB — URINALYSIS, COMPLETE
Bilirubin, UA: NEGATIVE
Glucose, UA: NEGATIVE
Ketones, UA: NEGATIVE
Leukocytes,UA: NEGATIVE
Nitrite, UA: NEGATIVE
Protein,UA: NEGATIVE
Specific Gravity, UA: 1.015 (ref 1.005–1.030)
Urobilinogen, Ur: 0.2 mg/dL (ref 0.2–1.0)
pH, UA: 7 (ref 5.0–7.5)

## 2023-05-31 LAB — MICROSCOPIC EXAMINATION

## 2023-06-06 ENCOUNTER — Ambulatory Visit: Payer: 59 | Admitting: Urology

## 2023-06-06 VITALS — BP 137/87 | HR 87 | Ht 69.0 in | Wt 170.0 lb

## 2023-06-06 DIAGNOSIS — R972 Elevated prostate specific antigen [PSA]: Secondary | ICD-10-CM

## 2023-06-06 DIAGNOSIS — Z2989 Encounter for other specified prophylactic measures: Secondary | ICD-10-CM | POA: Diagnosis not present

## 2023-06-06 DIAGNOSIS — N4232 Atypical small acinar proliferation of prostate: Secondary | ICD-10-CM | POA: Diagnosis not present

## 2023-06-06 DIAGNOSIS — C61 Malignant neoplasm of prostate: Secondary | ICD-10-CM | POA: Diagnosis not present

## 2023-06-06 MED ORDER — LEVOFLOXACIN 500 MG PO TABS
500.0000 mg | ORAL_TABLET | Freq: Once | ORAL | Status: AC
  Administered 2023-06-06: 500 mg via ORAL

## 2023-06-06 MED ORDER — GENTAMICIN SULFATE 40 MG/ML IJ SOLN
80.0000 mg | Freq: Once | INTRAMUSCULAR | Status: AC
  Administered 2023-06-06: 80 mg via INTRAMUSCULAR

## 2023-06-06 NOTE — Progress Notes (Unsigned)
   Prostate Biopsy Procedure   Informed consent was obtained after discussing risks/benefits of the procedure.  A time out was performed to ensure correct patient identity.  Pre-Procedure: - Last PSA Level: No results found for: "PSA" - Gentamicin given prophylactically - Levaquin 500 mg administered PO -Transrectal Ultrasound performed revealing a 50 gm prostate -No significant hypoechoic or median lobe noted  Procedure: - Prostate block performed using 10 cc 1% lidocaine and biopsies taken from sextant areas, a total of 12 under ultrasound guidance.  Post-Procedure: - Patient tolerated the procedure well - He was counseled to seek immediate medical attention if experiences any severe pain, significant bleeding, or fevers - Return in one week to discuss biopsy results  Irineo Axon, MD

## 2023-06-07 ENCOUNTER — Telehealth: Payer: Self-pay

## 2023-06-07 NOTE — Telephone Encounter (Signed)
 Patient calls triage line, LM requesting the results of his prostate bx.  Called patient back and advised that his bx was just done yesterday and that results will likely not be back for a week. He is scheduled for a results appointment in one week but states he cannot make that appointment due to work. He requests an afternoon appointment. Patient rescheduled.

## 2023-06-08 ENCOUNTER — Encounter: Payer: Self-pay | Admitting: Urology

## 2023-06-14 ENCOUNTER — Ambulatory Visit: Admitting: Urology

## 2023-07-11 ENCOUNTER — Encounter: Payer: Self-pay | Admitting: Urology

## 2023-07-11 ENCOUNTER — Ambulatory Visit: Admitting: Urology

## 2023-07-11 VITALS — BP 130/80 | HR 74 | Ht 70.0 in | Wt 170.0 lb

## 2023-07-11 DIAGNOSIS — C61 Malignant neoplasm of prostate: Secondary | ICD-10-CM

## 2023-07-11 NOTE — Progress Notes (Signed)
 I, Maysun Anabel Bene, acting as a scribe for Jonathon Altes, MD., have documented all relevant documentation on the behalf of Jonathon Altes, MD, as directed by Jonathon Altes, MD while in the presence of Jonathon Altes, MD.  07/11/2023 4:30 PM   Dannielle Huh Rhae Lerner [birth year] [id number]  Referring provider: Jacky Kindle, FNP No address on file  Chief Complaint  Patient presents with   Results    HPI: Jonathon Hunt is a 58 y.o. male presents for prostate biopsy follow-up.  TRUS/biopsy prostate performed 06/06/2023 for a PSA 22.8 Prostate volume 50 grams with no echogenic abnormalities. Standard 12-core biopsy was performed.  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 The LA showed Gleason 3+3 adenocarcinoma involving 1%, and LLM Gleason 3+3 adenocarcinoma involving 9% of the submitted tissue.    PMH: Past Medical History:  Diagnosis Date   Direct hernia    EtOH dependence (HCC)    H/O ETOH abuse    Hypertension    Pneumonia     Surgical History: Past Surgical History:  Procedure Laterality Date   COLONOSCOPY WITH PROPOFOL N/A 03/14/2022   Procedure: COLONOSCOPY WITH PROPOFOL;  Surgeon: Wyline Mood, MD;  Location: [facility] ENDOSCOPY;  Service: Gastroenterology;  Laterality: N/A;   LUNG SURGERY  2011   put a tube in to drain fluid out of lungs from pneumonia    Home Medications:  Allergies as of 07/11/2023       Reactions   Pine Hives        Medication List        Accurate as of July 11, 2023  4:30 PM. If you have any questions, ask your nurse or doctor.          losartan-hydrochlorothiazide 100-25 MG tablet Commonly known as: HYZAAR Take 1 tablet by mouth daily.        Allergies:  Allergies  Allergen Reactions   Pine Hives    Family History: Family History  Problem Relation Age of Onset   Hypertension Mother    Stroke  Mother    Lung cancer Father     Social History:  reports that he has been smoking cigarettes. He has a 7.5 pack-year smoking history. He has been exposed to tobacco smoke. He has quit using smokeless tobacco. He reports that he does not drink alcohol and does not use drugs.   Physical Exam: BP 130/80   Pulse 74   Ht 5\' 10"  (1.778 m)   Wt 170 lb (77.1 kg)   BMI 24.39 kg/m   Constitutional:  Alert and oriented, No acute distress. HEENT: [place] AT Respiratory: Normal respiratory effort, no increased work of breathing. Psychiatric: Normal mood and affect.  Assessment & Plan:    1. Prostate cancer NCCN risk stratification: high based on PSA level Schedule a PSMA/PET for high-risk staging.  If no evidence of metastatic disease, we discussed treatment options including radical prostatectomy and radiation modalities. Based on age, we discussed a greater chance of recurrence with radiation compared to surgery.  He stated that he was not interested in treatment. We discussed surveillance is not recommended for high-risk disease and encouraged him to sched the staging study.  PSMA/PET ordered, and he will be notified with the results.  I have reviewed the above documentation for accuracy and completeness, and I agree with the above.   Jonathon Altes, MD  [facility] [address], [address] [place], [place] [address] [phone]

## 2023-07-13 ENCOUNTER — Telehealth: Payer: Self-pay

## 2023-07-13 NOTE — Telephone Encounter (Signed)
 Copied from CRM [id number]. Topic: Appointments - Transfer of Care >> Jul 13, 2023  2:11 PM Tiffany S wrote: Pt is requesting to transfer FROM: NP Kelly Cedar  Pt is requesting to transfer TO: Dr Herold  Reason for requested transfer: Closer to home  It is the responsibility of the team the patient would like to transfer to (Dr.  Dr Herold ) to reach out to the patient if for any reason this transfer is not acceptable.

## 2023-09-07 ENCOUNTER — Encounter: Admitting: Pediatrics

## 2023-09-11 ENCOUNTER — Encounter: Payer: Self-pay | Admitting: Pediatrics

## 2023-09-11 ENCOUNTER — Ambulatory Visit: Admitting: Pediatrics

## 2023-09-11 VITALS — BP 128/78 | HR 75 | Temp 97.7°F | Ht 69.0 in | Wt 166.2 lb

## 2023-09-11 DIAGNOSIS — I10 Essential (primary) hypertension: Secondary | ICD-10-CM | POA: Diagnosis not present

## 2023-09-11 DIAGNOSIS — Z1330 Encounter for screening examination for mental health and behavioral disorders, unspecified: Secondary | ICD-10-CM | POA: Diagnosis not present

## 2023-09-11 DIAGNOSIS — K4020 Bilateral inguinal hernia, without obstruction or gangrene, not specified as recurrent: Secondary | ICD-10-CM

## 2023-09-11 DIAGNOSIS — M1611 Unilateral primary osteoarthritis, right hip: Secondary | ICD-10-CM

## 2023-09-11 DIAGNOSIS — M5126 Other intervertebral disc displacement, lumbar region: Secondary | ICD-10-CM | POA: Insufficient documentation

## 2023-09-11 DIAGNOSIS — C61 Malignant neoplasm of prostate: Secondary | ICD-10-CM | POA: Diagnosis not present

## 2023-09-11 DIAGNOSIS — M5020 Other cervical disc displacement, unspecified cervical region: Secondary | ICD-10-CM | POA: Insufficient documentation

## 2023-09-11 DIAGNOSIS — Z7689 Persons encountering health services in other specified circumstances: Secondary | ICD-10-CM

## 2023-09-11 NOTE — Assessment & Plan Note (Addendum)
 Diagnosed after bx back in march/April 2025. Had long discussion about his diagnosis and recommendations for imaging to assess for mets, declines. He understands and was informed about likely progression of disease without treatment. Will reach out to urology if changes his mind.

## 2023-09-11 NOTE — Assessment & Plan Note (Signed)
 Well controlled on hyzaar 100-25mg . No refills requested.

## 2023-09-11 NOTE — Patient Instructions (Addendum)

## 2023-09-11 NOTE — Progress Notes (Addendum)
 Establish Care Note  BP 128/78   Pulse 75   Temp 97.7 F (36.5 C) (Oral)   Ht 5' 9 (1.753 m)   Wt 166 lb 3.2 oz (75.4 kg)   SpO2 100%   BMI 24.54 kg/m    Subjective:    Patient ID: Jonathon Hunt, male    DOB: 1965-09-29, 58 y.o.   MRN: [id number]  HPI: Jonathon Hunt is a 58 y.o. male  Chief Complaint  Patient presents with   Establish Care    Hernia surgery needed     Establishing care, the following was discussed today:  Discussed the use of AI scribe software for clinical note transcription with the patient, who gave verbal consent to proceed.  History of Present Illness   Jonathon Hunt is a 58 year old male with a history of hernia and prostate issues who presents with ongoing hernia pain and concerns about prostate cancer.  He has been experiencing ongoing hernia pain for nearly a year. Initially, he sought care for hernia removal, but the procedure has not yet been completed. The pain is persistent and sometimes intermittent, impacting his daily life and causing frustration due to the delay in treatment.  He has undergone a prostate biopsy, which revealed concerns about prostate cancer. This finding has delayed the hernia surgery, as the surgical team is cautious about operating near the prostate area.  He has a history of arthritis, primarily affecting his knees and occasionally his hip. The symptoms are intermittent but recently persisted longer than usual before resolving. He manages the condition with over-the-counter medications as needed.  No current issues with arthritis, stating it has resolved for now.        Current Outpatient Medications on File Prior to Visit  Medication Sig Dispense Refill   losartan -hydrochlorothiazide (HYZAAR) 100-25 MG tablet Take 1 tablet by mouth daily. 90 tablet 3   No current facility-administered medications on file prior to visit.    #HM Will review HM records and updated as needed.  Relevant past medical,  surgical, family and social history reviewed and updated as indicated. Interim medical history since our last visit reviewed. Allergies and medications reviewed and updated.  ROS per HPI unless specifically indicated above     Objective:    BP 128/78   Pulse 75   Temp 97.7 F (36.5 C) (Oral)   Ht 5' 9 (1.753 m)   Wt 166 lb 3.2 oz (75.4 kg)   SpO2 100%   BMI 24.54 kg/m   Wt Readings from Last 3 Encounters:  09/11/23 166 lb 3.2 oz (75.4 kg)  07/11/23 170 lb (77.1 kg)  06/06/23 170 lb (77.1 kg)     Physical Exam Constitutional:      Appearance: Normal appearance.  Pulmonary:     Effort: Pulmonary effort is normal.   Musculoskeletal:        General: Normal range of motion.   Skin:    Comments: Normal skin color   Neurological:     General: No focal deficit present.     Mental Status: He is alert. Mental status is at baseline.   Psychiatric:        Mood and Affect: Mood normal.        Behavior: Behavior normal.        Thought Content: Thought content normal.         09/11/2023    4:07 PM 02/06/2023    3:29 PM 09/09/2021    4:04 PM  08/11/2021    1:58 PM  Depression screen PHQ 2/9  Decreased Interest 0 0 0 0  Down, Depressed, Hopeless 0 0 0 0  PHQ - 2 Score 0 0 0 0  Altered sleeping 0 0 0 0  Tired, decreased energy 0 0 0 0  Change in appetite 0 0 0 0  Feeling bad or failure about yourself  0 0 0 0  Trouble concentrating 0 0 0 0  Moving slowly or fidgety/restless 0 0 0 0  Suicidal thoughts 0 0 0 0  PHQ-9 Score 0 0 0 0  Difficult doing work/chores Not difficult at all Not difficult at all Not difficult at all Not difficult at all        09/11/2023    4:07 PM 02/06/2023    3:30 PM  GAD 7 : Generalized Anxiety Score  Nervous, Anxious, on Edge 0 0  Control/stop worrying 0 0  Worry too much - different things 0 0  Trouble relaxing 0 0  Restless 0 0  Easily annoyed or irritable 0 0  Afraid - awful might happen 0 0  Total GAD 7 Score 0 0  Anxiety  Difficulty Not difficult at all Not difficult at all       Assessment & Plan:  Assessment & Plan   Prostate cancer [facility]) Assessment & Plan: Diagnosed after bx back in march/April 2025. Had long discussion about his diagnosis and recommendations for imaging to assess for mets, declines. He understands and was informed about likely progression of disease without treatment. Will reach out to urology if changes his mind.   Bilateral inguinal hernia without obstruction or gangrene, recurrence not specified Assessment & Plan: Chronic inguinal hernia with worsening pain. Surgical repair delayed due to prostate cancer concerns. Declines prostate ca treatment, prioritizes hernia repair. - Message general surgeon to discuss if able to proceed w repair - Advise hospital care if hernia pain becomes constant or severe.   Arthritis of right hip Assessment & Plan: Intermittent arthritis, previously severe, currently resolved. - Advise to call if symptoms recur for pain management.   Primary hypertension Assessment & Plan: Well controlled on hyzaar 100-25mg . No refills requested.   Encounter to establish care Reviewed available patient record including history, medications, problem list. HM updated as able. Will review and/or request outside records (if applicable) and will fill remaining HM gaps as needed at follow up visit.  Encounter for behavioral health screening As part of their intake evaluation, the patient was screened for depression, anxiety.  PHQ9 SCORE 0, GAD7 SCORE 0. Screening results negative for tested conditions. CTM.   Follow up plan: Return in about 3 months (around 12/12/2023) for Physical.  Hadassah Letters, MD

## 2023-09-11 NOTE — Assessment & Plan Note (Signed)
 Intermittent arthritis, previously severe, currently resolved. - Advise to call if symptoms recur for pain management.

## 2023-09-11 NOTE — Assessment & Plan Note (Signed)
 Chronic inguinal hernia with worsening pain. Surgical repair delayed due to prostate cancer concerns. Declines prostate ca treatment, prioritizes hernia repair. - Message general surgeon to discuss if able to proceed w repair - Advise hospital care if hernia pain becomes constant or severe.

## 2023-09-12 ENCOUNTER — Telehealth: Payer: Self-pay

## 2023-09-12 ENCOUNTER — Telehealth: Payer: Self-pay | Admitting: Surgery

## 2023-09-12 NOTE — Telephone Encounter (Signed)
 Faxed vascular clearance to Dr. Mikki Alexander at [phone].

## 2023-09-12 NOTE — Telephone Encounter (Signed)
 Per Picoya set him up with an appt for ref for bilateral Inguinal hernia.. Ref by Dr Geraldine Kling. I have called 6 times with no answer and have left 2 message to give us  a call and we would get him scheduled.

## 2023-09-17 ENCOUNTER — Ambulatory Visit (INDEPENDENT_AMBULATORY_CARE_PROVIDER_SITE_OTHER): Admitting: Surgery

## 2023-09-17 ENCOUNTER — Encounter: Payer: Self-pay | Admitting: Surgery

## 2023-09-17 ENCOUNTER — Telehealth: Payer: Self-pay | Admitting: *Deleted

## 2023-09-17 VITALS — BP 129/86 | HR 79 | Temp 98.3°F | Ht 69.0 in | Wt 162.0 lb

## 2023-09-17 DIAGNOSIS — K4020 Bilateral inguinal hernia, without obstruction or gangrene, not specified as recurrent: Secondary | ICD-10-CM | POA: Diagnosis not present

## 2023-09-17 NOTE — Telephone Encounter (Signed)
 This encounter was created in error - please disregard.

## 2023-09-17 NOTE — Addendum Note (Signed)
 Addended by: CHAUNCEY JON BROCKS on: 09/17/2023 02:21 PM   Modules accepted: Orders, Level of Service

## 2023-09-17 NOTE — Progress Notes (Signed)
 09/17/2023  History of Present Illness: Jonathon Hunt is a 58 y.o. male presenting for follow up of bilateral inguinal hernias.  The patient was last seen on 04/25/23 for this, but at the time, he had had a very high PSA and was pending bipsy by Urology.  This was done and he has seen Dr. Twylla.  He had prostate biopsies which revealed prostate cancer.  He has discussed with Dr. Twylla about treatment options and further imaging for staging, but the patient has been hesitant to proceed with anything for his prostate because of his hernias.  He feels that he wants to get the hernias taken care of before dealing with anything else.  He is not necessarily against any treatment for his prostate, but currently his focus is on his hernias.  From the hernia standpoint, he feels they may have gotten a bit larger.  He certainly feels that they are becoming more bothersome as the day goes on and bulge more.  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   Direct hernia    Displacement of cervical intervertebral disc 09/11/2023   Displacement of lumbar intervertebral disc without myelopathy 09/11/2023   EtOH dependence (HCC)    H/O ETOH abuse    Hypertension    Lumbar spondylosis 03/15/2023   Pneumonia    Sacroiliac joint pain 03/15/2023     Past Surgical History: Past Surgical History:  Procedure Laterality Date   COLONOSCOPY WITH PROPOFOL  N/A 03/14/2022   Procedure: COLONOSCOPY WITH PROPOFOL ;  Surgeon: Therisa Bi, MD;  Location: [facility] ENDOSCOPY;  Service: Gastroenterology;  Laterality: N/A;   LUNG SURGERY  2011   put a tube in to drain fluid out of lungs from pneumonia    Home Medications: Prior to Admission medications   Medication Sig Start Date End Date Taking? Authorizing Provider  losartan -hydrochlorothiazide (HYZAAR) 100-25 MG tablet Take 1 tablet by mouth daily. 02/06/23   Emilio Kelly DASEN, FNP    Allergies: Allergies   Allergen Reactions   Pine Hives    Review of Systems: Review of Systems  Constitutional:  Negative for chills and fever.  Respiratory:  Negative for shortness of breath.   Cardiovascular:  Negative for chest pain.  Gastrointestinal:  Positive for abdominal pain (discomfort both groin areas). Negative for constipation, nausea and vomiting.  Musculoskeletal:  Negative for myalgias.    Physical Exam BP 129/86   Pulse 79   Temp 98.3 F (36.8 C) (Oral)   Ht 5' 9 (1.753 m)   Wt 162 lb (73.5 kg)   SpO2 98%   BMI 23.92 kg/m  CONSTITUTIONAL: No acute distress, well nourished. HEENT:  Normocephalic, atraumatic, extraocular motion intact. RESPIRATORY:  Lungs are clear, and breath sounds are equal bilaterally. Normal respiratory effort without pathologic use of accessory muscles. CARDIOVASCULAR: Heart is regular without murmurs, gallops, or rubs. GI: The abdomen is soft, non-distended, currently non-tender to palpation.  His hernias appear stable, with both sides being reducible, and again right side larger containing bowel likely, and left side smaller containing fat likely. No umbilical hernia noted. NEUROLOGIC:  Motor and sensation is grossly normal.  Cranial nerves are grossly intact. PSYCH:  Alert and oriented to person, place and time. Affect is normal.  Labs/Imaging: None new  Assessment and Plan: This is a 58 y.o. male with reducible bilateral inguinal hernias.  --Discussed with the patient that his hernias remain stable and reducible.  He is interested in proceeding with  hernia repair and he feels they are becoming more bothersome.  He wanted to address this first before considering treatment for his prostate.  Given that treatment for his prostate is not out of the question, discussed with him that we would proceed with open repair bilaterally instead of robotically so that we do not create additional scar tissue in the retropubic space in case he pursues surgical management.   He is in agreement. --Discussed with the patient then the plan for open bilateral inguinal hernia repair.  Reviewed the surgery at length with him including the planned incisions, the risks of bleeding, infection, injury to surrounding structures, that this would be an outpatient surgery, the use of mesh, post-operative activity restrictions, pain control, and he's willing to proceed. --Will schedule him for surgery on 10/02/23 based on his scheduling preference.  Will also send for medical clearance.  All of his questions have been answered.  I spent 30 minutes dedicated to the care of this patient on the date of this encounter to include pre-visit review of records, face-to-face time with the patient discussing diagnosis and management, and any post-visit coordination of care.   [name], MD [facility]

## 2023-09-17 NOTE — Telephone Encounter (Signed)
 Faxed Medical Clearance to Dr Hadassah Nett at [phone]

## 2023-09-17 NOTE — Patient Instructions (Signed)
 You have chose to have your hernia repaired. This will be done by Dr. Aleen Campi at [facility].  If you are on any injectable weight loss medication, you will need to stop taking your GLP-1 injectable (weight loss) medications 8 days before your surgery to avoid any complications with anesthesia.   Please see your (blue) Pre-care information that you have been given today. Our surgery scheduler will call you to verify surgery date and to go over information.   You will need to arrange to be out of work for approximately 1-2 weeks and then you may return with a lifting restriction for 4 more weeks. If you have FMLA or Disability paperwork that needs to be filled out, please have your company fax your paperwork to [phone] or you may drop this by either office. This paperwork will be filled out within 3 days after your surgery has been completed.  You may have a bruise in your groin and also swelling and brusing in your testicle area. You may use ice 4-5 times daily for 15-20 minutes each time. Make sure that you place a barrier between you and the ice pack. To decrease the swelling, you may roll up a bath towel and place it vertically in between your thighs with your testicles resting on the towel. You will want to keep this area elevated as much as possible for several days following surgery.    Inguinal Hernia, Adult Muscles help keep everything in the body in its proper place. But if a weak spot in the muscles develops, something can poke through. That is called a hernia. When this happens in the lower part of the belly (abdomen), it is called an inguinal hernia. (It takes its name from a part of the body in this region called the inguinal canal.) A weak spot in the wall of muscles lets some fat or part of the small intestine bulge through. An inguinal hernia can develop at any age. Men get them more often than women. CAUSES  In adults, an inguinal hernia develops over time. It can be  triggered by: Suddenly straining the muscles of the lower abdomen. Lifting heavy objects. Straining to have a bowel movement. Difficult bowel movements (constipation) can lead to this. Constant coughing. This may be caused by smoking or lung disease. Being overweight. Being pregnant. Working at a job that requires long periods of standing or heavy lifting. Having had an inguinal hernia before. One type can be an emergency situation. It is called a strangulated inguinal hernia. It develops if part of the small intestine slips through the weak spot and cannot get back into the abdomen. The blood supply can be cut off. If that happens, part of the intestine may die. This situation requires emergency surgery. SYMPTOMS  Often, a small inguinal hernia has no symptoms. It is found when a healthcare provider does a physical exam. Larger hernias usually have symptoms.  In adults, symptoms may include: A lump in the groin. This is easier to see when the person is standing. It might disappear when lying down. In men, a lump in the scrotum. Pain or burning in the groin. This occurs especially when lifting, straining or coughing. A dull ache or feeling of pressure in the groin. Signs of a strangulated hernia can include: A bulge in the groin that becomes very painful and tender to the touch. A bulge that turns red or purple. Fever, nausea and vomiting. Inability to have a bowel movement or to pass gas.  DIAGNOSIS  To decide if you have an inguinal hernia, a healthcare provider will probably do a physical examination. This will include asking questions about any symptoms you have noticed. The healthcare provider might feel the groin area and ask you to cough. If an inguinal hernia is felt, the healthcare provider may try to slide it back into the abdomen. Usually no other tests are needed. TREATMENT  Treatments can vary. The size of the hernia makes a difference. Options include: Watchful waiting. This  is often suggested if the hernia is small and you have had no symptoms. No medical procedure will be done unless symptoms develop. You will need to watch closely for symptoms. If any occur, contact your healthcare provider right away. Surgery. This is used if the hernia is larger or you have symptoms. Open surgery. This is usually an outpatient procedure (you will not stay overnight in a hospital). An cut (incision) is made through the skin in the groin. The hernia is put back inside the abdomen. The weak area in the muscles is then repaired by herniorrhaphy or hernioplasty. Herniorrhaphy: in this type of surgery, the weak muscles are sewn back together. Hernioplasty: a patch or mesh is used to close the weak area in the abdominal wall. Laparoscopy. In this procedure, a surgeon makes small incisions. A thin tube with a tiny video camera (called a laparoscope) is put into the abdomen. The surgeon repairs the hernia with mesh by looking with the video camera and using two long instruments. HOME CARE INSTRUCTIONS  After surgery to repair an inguinal hernia: You will need to take pain medicine prescribed by your healthcare provider. Follow all directions carefully. You will need to take care of the wound from the incision. Your activity will be restricted for awhile. This will probably include no heavy lifting for several weeks. You also should not do anything too active for a few weeks. When you can return to work will depend on the type of job that you have. During "watchful waiting" periods, you should: Maintain a healthy weight. Eat a diet high in fiber (fruits, vegetables and whole grains). Drink plenty of fluids to avoid constipation. This means drinking enough water and other liquids to keep your urine clear or pale yellow. Do not lift heavy objects. Do not stand for long periods of time. Quit smoking. This should keep you from developing a frequent cough. SEEK MEDICAL CARE IF:  A bulge  develops in your groin area. You feel pain, a burning sensation or pressure in the groin. This might be worse if you are lifting or straining. You develop a fever of more than 100.5 F (38.1 C). SEEK IMMEDIATE MEDICAL CARE IF:  Pain in the groin increases suddenly. A bulge in the groin gets bigger suddenly and does not go down. For men, there is sudden pain in the scrotum. Or, the size of the scrotum increases. A bulge in the groin area becomes red or purple and is painful to touch. You have nausea or vomiting that does not go away. You feel your heart beating much faster than normal. You cannot have a bowel movement or pass gas. You develop a fever of more than 102.0 F (38.9 C).   This information is not intended to replace advice given to you by your health care provider. Make sure you discuss any questions you have with your health care provider.   Document Released: 07/30/2008 Document Revised: 06/05/2011 Document Reviewed: 09/14/2014 Elsevier Interactive Patient Education [employer]

## 2023-09-17 NOTE — H&P (View-Only) (Signed)
 09/17/2023  History of Present Illness: Jonathon Hunt is a 58 y.o. male presenting for follow up of bilateral inguinal hernias.  The patient was last seen on 04/25/23 for this, but at the time, he had had a very high PSA and was pending bipsy by Urology.  This was done and he has seen Dr. Twylla.  He had prostate biopsies which revealed prostate cancer.  He has discussed with Dr. Twylla about treatment options and further imaging for staging, but the patient has been hesitant to proceed with anything for his prostate because of his hernias.  He feels that he wants to get the hernias taken care of before dealing with anything else.  He is not necessarily against any treatment for his prostate, but currently his focus is on his hernias.  From the hernia standpoint, he feels they may have gotten a bit larger.  He certainly feels that they are becoming more bothersome as the day goes on and bulge more.  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   Direct hernia    Displacement of cervical intervertebral disc 09/11/2023   Displacement of lumbar intervertebral disc without myelopathy 09/11/2023   EtOH dependence (HCC)    H/O ETOH abuse    Hypertension    Lumbar spondylosis 03/15/2023   Pneumonia    Sacroiliac joint pain 03/15/2023     Past Surgical History: Past Surgical History:  Procedure Laterality Date   COLONOSCOPY WITH PROPOFOL  N/A 03/14/2022   Procedure: COLONOSCOPY WITH PROPOFOL ;  Surgeon: Therisa Bi, MD;  Location: [facility] ENDOSCOPY;  Service: Gastroenterology;  Laterality: N/A;   LUNG SURGERY  2011   put a tube in to drain fluid out of lungs from pneumonia    Home Medications: Prior to Admission medications   Medication Sig Start Date End Date Taking? Authorizing Provider  losartan -hydrochlorothiazide (HYZAAR) 100-25 MG tablet Take 1 tablet by mouth daily. 02/06/23   Emilio Kelly DASEN, FNP    Allergies: Allergies   Allergen Reactions   Pine Hives    Review of Systems: Review of Systems  Constitutional:  Negative for chills and fever.  Respiratory:  Negative for shortness of breath.   Cardiovascular:  Negative for chest pain.  Gastrointestinal:  Positive for abdominal pain (discomfort both groin areas). Negative for constipation, nausea and vomiting.  Musculoskeletal:  Negative for myalgias.    Physical Exam BP 129/86   Pulse 79   Temp 98.3 F (36.8 C) (Oral)   Ht 5' 9 (1.753 m)   Wt 162 lb (73.5 kg)   SpO2 98%   BMI 23.92 kg/m  CONSTITUTIONAL: No acute distress, well nourished. HEENT:  Normocephalic, atraumatic, extraocular motion intact. RESPIRATORY:  Lungs are clear, and breath sounds are equal bilaterally. Normal respiratory effort without pathologic use of accessory muscles. CARDIOVASCULAR: Heart is regular without murmurs, gallops, or rubs. GI: The abdomen is soft, non-distended, currently non-tender to palpation.  His hernias appear stable, with both sides being reducible, and again right side larger containing bowel likely, and left side smaller containing fat likely. No umbilical hernia noted. NEUROLOGIC:  Motor and sensation is grossly normal.  Cranial nerves are grossly intact. PSYCH:  Alert and oriented to person, place and time. Affect is normal.  Labs/Imaging: None new  Assessment and Plan: This is a 58 y.o. male with reducible bilateral inguinal hernias.  --Discussed with the patient that his hernias remain stable and reducible.  He is interested in proceeding with  hernia repair and he feels they are becoming more bothersome.  He wanted to address this first before considering treatment for his prostate.  Given that treatment for his prostate is not out of the question, discussed with him that we would proceed with open repair bilaterally instead of robotically so that we do not create additional scar tissue in the retropubic space in case he pursues surgical management.   He is in agreement. --Discussed with the patient then the plan for open bilateral inguinal hernia repair.  Reviewed the surgery at length with him including the planned incisions, the risks of bleeding, infection, injury to surrounding structures, that this would be an outpatient surgery, the use of mesh, post-operative activity restrictions, pain control, and he's willing to proceed. --Will schedule him for surgery on 10/02/23 based on his scheduling preference.  Will also send for medical clearance.  All of his questions have been answered.  I spent 30 minutes dedicated to the care of this patient on the date of this encounter to include pre-visit review of records, face-to-face time with the patient discussing diagnosis and management, and any post-visit coordination of care.   [name], MD [facility]

## 2023-09-18 ENCOUNTER — Telehealth: Payer: Self-pay | Admitting: Surgery

## 2023-09-18 NOTE — Telephone Encounter (Signed)
 Patient has been advised of Pre-Admission date/time, and Surgery date at [facility].  Surgery Date: 10/02/23 Preadmission Testing Date: 09/21/23 (phone 8a-1p)  Patient informed of the scheduling process and surgery information given at time of office visit.  Patient has been made aware to call [phone], between 1-3:00pm the day before surgery, to find out what time to arrive for surgery.

## 2023-09-20 ENCOUNTER — Encounter
Admission: RE | Admit: 2023-09-20 | Discharge: 2023-09-20 | Disposition: A | Discharge status: Home / Self Care | Source: Ambulatory Visit | Attending: Surgery | Admitting: Surgery

## 2023-09-20 ENCOUNTER — Other Ambulatory Visit: Payer: Self-pay

## 2023-09-20 DIAGNOSIS — I10 Essential (primary) hypertension: Secondary | ICD-10-CM

## 2023-09-20 HISTORY — DX: Malignant (primary) neoplasm, unspecified: C80.1

## 2023-09-20 HISTORY — DX: Bilateral inguinal hernia, without obstruction or gangrene, recurrent: K40.21

## 2023-09-20 NOTE — Patient Instructions (Addendum)
 Your procedure is scheduled on: 10/02/23 - Tuesday Report to the Registration Desk on the 1st floor of the [facility]. To find out your arrival time, please call [phone] between 1PM - 3PM on: 10/01/23 - Monday If your arrival time is 6:00 am, do not arrive before that time as the [facility] entrance doors do not open until 6:00 am.  REMEMBER: Instructions that are not followed completely may result in serious medical risk, up to and including death; or upon the discretion of your surgeon and anesthesiologist your surgery may need to be rescheduled.  Do not eat food after midnight the night before surgery.  No gum chewing or hard candies.  You may however, drink CLEAR liquids up to 2 hours before you are scheduled to arrive for your surgery. Do not drink anything within 2 hours of your scheduled arrival time.  Clear liquids include: - water   - apple juice without pulp - gatorade (not RED colors) - black coffee or tea (Do NOT add milk or creamers to the coffee or tea) Do NOT drink anything that is not on this list.  One week prior to surgery: Stop Anti-inflammatories (NSAIDS) such as Advil, Aleve, Ibuprofen, Motrin, Naproxen, Naprosyn and Aspirin based products such as Excedrin, Goody's Powder, BC Powder. You may take Tylenol  if needed for pain up until the day of surgery.  Stop ANY OVER THE COUNTER supplements until after surgery : Multivitamin  ON THE DAY OF SURGERY ONLY TAKE THESE MEDICATIONS WITH SIPS OF WATER :  none   No Alcohol  for 24 hours before or after surgery.  No Smoking including e-cigarettes for 24 hours before surgery.  No chewable tobacco products for at least 6 hours before surgery.  No nicotine  patches on the day of surgery.  Do not use any recreational drugs for at least a week (preferably 2 weeks) before your surgery.  Please be advised that the combination of cocaine and anesthesia may have negative outcomes, up to and including death. If you test  positive for cocaine, your surgery will be cancelled.  On the morning of surgery brush your teeth with toothpaste and water , you may rinse your mouth with mouthwash if you wish. Do not swallow any toothpaste or mouthwash.  Do not wear jewelry, make-up, hairpins, clips or nail polish.  For welded (permanent) jewelry: bracelets, anklets, waist bands, etc.  Please have this removed prior to surgery.  If it is not removed, there is a chance that hospital personnel will need to cut it off on the day of surgery.  Do not wear lotions, powders, or perfumes.   Do not shave body hair from the neck down 48 hours before surgery.  Contact lenses, hearing aids and dentures may not be worn into surgery.  Do not bring valuables to the hospital. [facility] is not responsible for any missing/lost belongings or valuables.   Notify your doctor if there is any change in your medical condition (cold, fever, infection).  Wear comfortable clothing (specific to your surgery type) to the hospital.  After surgery, you can help prevent lung complications by doing breathing exercises.  Take deep breaths and cough every 1-2 hours. Your doctor may order a device called an Incentive Spirometer to help you take deep breaths. When coughing or sneezing, hold a pillow firmly against your incision with both hands. This is called "splinting." Doing this helps protect your incision. It also decreases belly discomfort.  If you are being admitted to the hospital overnight, leave your  suitcase in the car. After surgery it may be brought to your room.  In case of increased patient census, it may be necessary for you, the patient, to continue your postoperative care in the Same Day Surgery department.  If you are being discharged the day of surgery, you will not be allowed to drive home. You will need a responsible individual to drive you home and stay with you for 24 hours after surgery.   If you are taking public  transportation, you will need to have a responsible individual with you.  Please call the Pre-admissions Testing Dept. at [phone] if you have any questions about these instructions.  Surgery Visitation Policy:  Patients having surgery or a procedure may have two visitors.  Children under the age of 31 must have an adult with them who is not the patient.  Inpatient Visitation:    Visiting hours are 7 a.m. to 8 p.m. Up to four visitors are allowed at one time in a patient room. The visitors may rotate out with other people during the day.  One visitor age 33 or older may stay with the patient overnight and must be in the room by 8 p.m.   Merchandiser, retail to address health-related social needs:  [email or web address]     Preparing for Surgery with CHLORHEXIDINE GLUCONATE (CHG) Soap  Chlorhexidine Gluconate (CHG) Soap  o An antiseptic cleaner that kills germs and bonds with the skin to continue killing germs even after washing  o Used for showering the night before surgery and morning of surgery  Before surgery, you can play an important role by reducing the number of germs on your skin.  CHG (Chlorhexidine gluconate) soap is an antiseptic cleanser which kills germs and bonds with the skin to continue killing germs even after washing.  Please do not use if you have an allergy to CHG or antibacterial soaps. If your skin becomes reddened/irritated stop using the CHG.  1. Shower the NIGHT BEFORE SURGERY and the MORNING OF SURGERY with CHG soap.  2. If you choose to wash your hair, wash your hair first as usual with your normal shampoo.  3. After shampooing, rinse your hair and body thoroughly to remove the shampoo.  4. Use CHG as you would any other liquid soap. You can apply CHG directly to the skin and wash gently with a scrungie or a clean washcloth.  5. Apply the CHG soap to your body only from the neck down. Do not use on open wounds or open  sores. Avoid contact with your eyes, ears, mouth, and genitals (private parts). Wash face and genitals (private parts) with your normal soap.  6. Wash thoroughly, paying special attention to the area where your surgery will be performed.  7. Thoroughly rinse your body with warm water .  8. Do not shower/wash with your normal soap after using and rinsing off the CHG soap.  9. Pat yourself dry with a clean towel.  10. Wear clean pajamas to bed the night before surgery.  12. Place clean sheets on your bed the night of your first shower and do not sleep with pets.  13. Shower again with the CHG soap on the day of surgery prior to arriving at the hospital.  14. Do not apply any deodorants/lotions/powders.  15. Please wear clean clothes to the hospital.

## 2023-09-21 ENCOUNTER — Ambulatory Visit: Payer: Self-pay | Admitting: Pediatrics

## 2023-09-21 ENCOUNTER — Ambulatory Visit: Admitting: Pediatrics

## 2023-09-21 ENCOUNTER — Encounter: Payer: Self-pay | Admitting: Pediatrics

## 2023-09-21 ENCOUNTER — Inpatient Hospital Stay: Admission: RE | Admit: 2023-09-21 | Source: Ambulatory Visit

## 2023-09-21 VITALS — BP 129/82 | HR 73 | Temp 98.2°F | Resp 15 | Ht 69.02 in | Wt 166.2 lb

## 2023-09-21 DIAGNOSIS — Z01818 Encounter for other preprocedural examination: Secondary | ICD-10-CM | POA: Diagnosis not present

## 2023-09-21 DIAGNOSIS — K4020 Bilateral inguinal hernia, without obstruction or gangrene, not specified as recurrent: Secondary | ICD-10-CM

## 2023-09-21 DIAGNOSIS — I10 Essential (primary) hypertension: Secondary | ICD-10-CM

## 2023-09-21 LAB — CBC
Hematocrit: 43.2 % (ref 37.5–51.0)
Hemoglobin: 15.6 g/dL (ref 13.0–17.7)
MCH: 30.2 pg (ref 26.6–33.0)
MCHC: 36.1 g/dL — ABNORMAL HIGH (ref 31.5–35.7)
MCV: 84 fL (ref 79–97)
Platelets: 220 10*3/uL (ref 150–450)
RBC: 5.17 x10E6/uL (ref 4.14–5.80)
RDW: 14.3 % (ref 11.6–15.4)
WBC: 9.1 10*3/uL (ref 3.4–10.8)

## 2023-09-21 NOTE — Progress Notes (Unsigned)
 Office Visit  BP 129/82 (BP Location: Left Arm, Patient Position: Sitting, Cuff Size: Normal)   Pulse 73   Temp 98.2 F (36.8 C) (Oral)   Resp 15   Ht 5' 9.02 (1.753 m)   Wt 166 lb 3.2 oz (75.4 kg)   SpO2 99%   BMI 24.53 kg/m    Subjective:    Patient ID: Jonathon Hunt, male    DOB: [birth year], 58 y.o.   MRN: [id number]  HPI: Jonathon Hunt is a 59 y.o. male  Chief Complaint  Patient presents with   Pre-op Exam    Surgery is scheduled for 10/02/2023, Hernia repair   #preop clearance Patient w upcoming inguinal hernia repair No chest pain, sob, palpations, orthopnea, dyspnea, PND, lower extremity edema. Smoker   Relevant past medical, surgical, family and social history reviewed and updated as indicated. Interim medical history since our last visit reviewed. Allergies and medications reviewed and updated.  ROS per HPI unless specifically indicated above     Objective:    BP 129/82 (BP Location: Left Arm, Patient Position: Sitting, Cuff Size: Normal)   Pulse 73   Temp 98.2 F (36.8 C) (Oral)   Resp 15   Ht 5' 9.02 (1.753 m)   Wt 166 lb 3.2 oz (75.4 kg)   SpO2 99%   BMI 24.53 kg/m   Wt Readings from Last 3 Encounters:  09/21/23 166 lb 3.2 oz (75.4 kg)  09/17/23 162 lb (73.5 kg)  09/11/23 166 lb 3.2 oz (75.4 kg)     Physical Exam Constitutional:      Appearance: Normal appearance.  HENT:     Head: Normocephalic and atraumatic.   Eyes:     Pupils: Pupils are equal, round, and reactive to light.    Cardiovascular:     Rate and Rhythm: Normal rate and regular rhythm.     Pulses: Normal pulses.     Heart sounds: Normal heart sounds.  Pulmonary:     Effort: Pulmonary effort is normal.     Breath sounds: Normal breath sounds.  Abdominal:     General: Abdomen is flat.     Palpations: Abdomen is soft.   Musculoskeletal:        General: Normal range of motion.     Cervical back: Normal range of motion.   Skin:    General: Skin is warm and dry.      Capillary Refill: Capillary refill takes less than 2 seconds.   Neurological:     General: No focal deficit present.     Mental Status: He is alert. Mental status is at baseline.   Psychiatric:        Mood and Affect: Mood normal.        Behavior: Behavior normal.         09/21/2023    3:54 PM 09/21/2023    3:53 PM 09/11/2023    4:07 PM 02/06/2023    3:29 PM 09/09/2021    4:04 PM  Depression screen PHQ 2/9  Decreased Interest 0 0 0 0 0  Down, Depressed, Hopeless 0 0 0 0 0  PHQ - 2 Score 0 0 0 0 0  Altered sleeping 0 0 0 0 0  Tired, decreased energy 0 0 0 0 0  Change in appetite 0 0 0 0 0  Feeling bad or failure about yourself  0 0 0 0 0  Trouble concentrating 0 0 0 0 0  Moving slowly or fidgety/restless 0 0 0  0 0  Suicidal thoughts 0 0 0 0 0  PHQ-9 Score 0 0 0 0 0  Difficult doing work/chores   Not difficult at all Not difficult at all Not difficult at all       09/11/2023    4:07 PM 02/06/2023    3:30 PM  GAD 7 : Generalized Anxiety Score  Nervous, Anxious, on Edge 0 0  Control/stop worrying 0 0  Worry too much - different things 0 0  Trouble relaxing 0 0  Restless 0 0  Easily annoyed or irritable 0 0  Afraid - awful might happen 0 0  Total GAD 7 Score 0 0  Anxiety Difficulty Not difficult at all Not difficult at all       Assessment & Plan:  Assessment & Plan   Preop examination Bilateral inguinal hernia without obstruction or gangrene, recurrence not specified Assessment & Plan: EKG normal. Preop labs as below. Patient RCRI risk 3.9%, lowest possible risk. Will fax EKG and labs to surgery team. -     EKG 12-Lead -     CBC -     Basic metabolic panel with GFR   Primary hypertension Assessment & Plan: Consider holding hyzaar day of the procedure. Otherwise low risk.   Follow up plan: Return if symptoms worsen or fail to improve.  Hadassah SHAUNNA Nett, MD

## 2023-09-21 NOTE — Patient Instructions (Signed)
 We will send the medical clearance to your surgeon.

## 2023-09-22 LAB — BASIC METABOLIC PANEL WITH GFR
BUN/Creatinine Ratio: 12 (ref 10–24)
BUN: 12 mg/dL (ref 8–27)
CO2: 23 mmol/L (ref 20–29)
Calcium: 9 mg/dL (ref 8.6–10.2)
Chloride: 104 mmol/L (ref 96–106)
Creatinine, Ser: 0.97 mg/dL (ref 0.76–1.27)
Glucose: 86 mg/dL (ref 70–99)
Potassium: 4.2 mmol/L (ref 3.5–5.2)
Sodium: 139 mmol/L (ref 134–144)
eGFR: 89 mL/min/{1.73_m2} (ref >59)

## 2023-09-24 ENCOUNTER — Encounter: Payer: Self-pay | Admitting: Pediatrics

## 2023-09-24 NOTE — Assessment & Plan Note (Signed)
 Consider holding hyzaar day of the procedure. Otherwise low risk.

## 2023-09-24 NOTE — Assessment & Plan Note (Signed)
 EKG normal. Preop labs as below. Patient RCRI risk 3.9%, lowest possible risk. Will fax EKG and labs to surgery team.

## 2023-09-25 ENCOUNTER — Encounter
Admission: RE | Admit: 2023-09-25 | Discharge: 2023-09-25 | Disposition: A | Discharge status: Home / Self Care | Source: Ambulatory Visit | Attending: Surgery | Admitting: Surgery

## 2023-09-25 DIAGNOSIS — I10 Essential (primary) hypertension: Secondary | ICD-10-CM

## 2023-10-01 ENCOUNTER — Telehealth: Payer: Self-pay | Admitting: *Deleted

## 2023-10-01 NOTE — Telephone Encounter (Signed)
 Medical Clearance received from Dr. Hadassah Nett, low risk, Patient is optimized for surgery

## 2023-10-02 ENCOUNTER — Encounter: Payer: Self-pay | Admitting: Surgery

## 2023-10-02 ENCOUNTER — Other Ambulatory Visit: Payer: Self-pay

## 2023-10-02 ENCOUNTER — Ambulatory Visit
Admission: RE | Admit: 2023-10-02 | Discharge: 2023-10-02 | Disposition: A | Discharge status: Home / Self Care | Attending: Surgery | Admitting: Surgery

## 2023-10-02 ENCOUNTER — Ambulatory Visit: Payer: Self-pay | Admitting: Urgent Care

## 2023-10-02 ENCOUNTER — Encounter
Admission: RE | Disposition: A | Discharge status: Home / Self Care | Payer: Self-pay | Source: Home / Self Care | Attending: Surgery

## 2023-10-02 ENCOUNTER — Ambulatory Visit: Admitting: Certified Registered"

## 2023-10-02 DIAGNOSIS — K4020 Bilateral inguinal hernia, without obstruction or gangrene, not specified as recurrent: Secondary | ICD-10-CM | POA: Diagnosis present

## 2023-10-02 DIAGNOSIS — I10 Essential (primary) hypertension: Secondary | ICD-10-CM | POA: Insufficient documentation

## 2023-10-02 DIAGNOSIS — F17200 Nicotine dependence, unspecified, uncomplicated: Secondary | ICD-10-CM | POA: Insufficient documentation

## 2023-10-02 DIAGNOSIS — C61 Malignant neoplasm of prostate: Secondary | ICD-10-CM | POA: Diagnosis not present

## 2023-10-02 HISTORY — PX: INGUINAL HERNIA REPAIR: SHX194

## 2023-10-02 HISTORY — PX: INSERTION OF MESH: SHX5868

## 2023-10-02 SURGERY — REPAIR, HERNIA, INGUINAL, ADULT
Anesthesia: General | Site: Inguinal | Laterality: Bilateral

## 2023-10-02 MED ORDER — LIDOCAINE HCL (CARDIAC) PF 100 MG/5ML IV SOSY
PREFILLED_SYRINGE | INTRAVENOUS | Status: DC | PRN
  Administered 2023-10-02: 100 mg via INTRAVENOUS

## 2023-10-02 MED ORDER — MIDAZOLAM HCL 2 MG/2ML IJ SOLN
INTRAMUSCULAR | Status: AC
  Filled 2023-10-02: qty 2

## 2023-10-02 MED ORDER — ROCURONIUM BROMIDE 10 MG/ML (PF) SYRINGE
PREFILLED_SYRINGE | INTRAVENOUS | Status: AC
  Filled 2023-10-02: qty 10

## 2023-10-02 MED ORDER — LACTATED RINGERS IV SOLN: INTRAVENOUS | Status: DC

## 2023-10-02 MED ORDER — GABAPENTIN 300 MG PO CAPS
300.0000 mg | ORAL_CAPSULE | ORAL | Status: AC
  Administered 2023-10-02: 300 mg via ORAL

## 2023-10-02 MED ORDER — DEXMEDETOMIDINE HCL IN NACL 80 MCG/20ML IV SOLN
INTRAVENOUS | Status: AC
  Filled 2023-10-02: qty 20

## 2023-10-02 MED ORDER — PHENYLEPHRINE 80 MCG/ML (10ML) SYRINGE FOR IV PUSH (FOR BLOOD PRESSURE SUPPORT)
PREFILLED_SYRINGE | INTRAVENOUS | Status: DC | PRN
  Administered 2023-10-02 (×3): 80 ug via INTRAVENOUS

## 2023-10-02 MED ORDER — FENTANYL CITRATE PF 50 MCG/ML IJ SOSY
50.0000 ug | PREFILLED_SYRINGE | INTRAMUSCULAR | Status: AC | PRN
  Administered 2023-10-02 (×2): 25 ug via INTRAVENOUS

## 2023-10-02 MED ORDER — CEFAZOLIN SODIUM-DEXTROSE 2-4 GM/100ML-% IV SOLN
INTRAVENOUS | Status: AC
  Filled 2023-10-02: qty 100

## 2023-10-02 MED ORDER — BUPIVACAINE LIPOSOME 1.3 % IJ SUSP: 20.0000 mL | Freq: Once | INTRAMUSCULAR | Status: DC

## 2023-10-02 MED ORDER — GABAPENTIN 300 MG PO CAPS
ORAL_CAPSULE | ORAL | Status: AC
Start: 2023-10-02 — End: 2023-10-02
  Filled 2023-10-02: qty 1

## 2023-10-02 MED ORDER — FENTANYL CITRATE (PF) 100 MCG/2ML IJ SOLN
INTRAMUSCULAR | Status: AC
  Filled 2023-10-02: qty 2

## 2023-10-02 MED ORDER — SUGAMMADEX SODIUM 200 MG/2ML IV SOLN
INTRAVENOUS | Status: DC | PRN
  Administered 2023-10-02: 160 mg via INTRAVENOUS

## 2023-10-02 MED ORDER — CEFAZOLIN SODIUM-DEXTROSE 2-4 GM/100ML-% IV SOLN
2.0000 g | INTRAVENOUS | Status: AC
  Administered 2023-10-02: 2 g via INTRAVENOUS

## 2023-10-02 MED ORDER — CHLORHEXIDINE GLUCONATE 0.12 % MT SOLN
15.0000 mL | Freq: Once | OROMUCOSAL | Status: AC
  Administered 2023-10-02: 15 mL via OROMUCOSAL

## 2023-10-02 MED ORDER — LIDOCAINE HCL (PF) 2 % IJ SOLN
INTRAMUSCULAR | Status: AC
  Filled 2023-10-02: qty 5

## 2023-10-02 MED ORDER — ACETAMINOPHEN 500 MG PO TABS
ORAL_TABLET | ORAL | Status: AC
Start: 2023-10-02 — End: 2023-10-02
  Filled 2023-10-02: qty 2

## 2023-10-02 MED ORDER — BUPIVACAINE LIPOSOME 1.3 % IJ SUSP
INTRAMUSCULAR | Status: AC
  Filled 2023-10-02: qty 20

## 2023-10-02 MED ORDER — CHLORHEXIDINE GLUCONATE CLOTH 2 % EX PADS: 6.0000 | MEDICATED_PAD | Freq: Once | CUTANEOUS | Status: DC

## 2023-10-02 MED ORDER — PROPOFOL 10 MG/ML IV BOLUS
INTRAVENOUS | Status: AC
  Filled 2023-10-02: qty 20

## 2023-10-02 MED ORDER — KETOROLAC TROMETHAMINE 30 MG/ML IJ SOLN
INTRAMUSCULAR | Status: AC
  Filled 2023-10-02: qty 1

## 2023-10-02 MED ORDER — PHENYLEPHRINE 80 MCG/ML (10ML) SYRINGE FOR IV PUSH (FOR BLOOD PRESSURE SUPPORT)
PREFILLED_SYRINGE | INTRAVENOUS | Status: AC
  Filled 2023-10-02: qty 10

## 2023-10-02 MED ORDER — DEXAMETHASONE SODIUM PHOSPHATE 10 MG/ML IJ SOLN
INTRAMUSCULAR | Status: AC
  Filled 2023-10-02: qty 1

## 2023-10-02 MED ORDER — CHLORHEXIDINE GLUCONATE 0.12 % MT SOLN
OROMUCOSAL | Status: AC
  Filled 2023-10-02: qty 15

## 2023-10-02 MED ORDER — ONDANSETRON HCL 4 MG/2ML IJ SOLN
INTRAMUSCULAR | Status: AC
  Filled 2023-10-02: qty 2

## 2023-10-02 MED ORDER — HYDROMORPHONE HCL 1 MG/ML IJ SOLN: 0.5000 mg | INTRAMUSCULAR | Status: DC | PRN

## 2023-10-02 MED ORDER — ROCURONIUM BROMIDE 100 MG/10ML IV SOLN
INTRAVENOUS | Status: DC | PRN
Start: 2023-10-02 — End: 2023-10-02
  Administered 2023-10-02: 10 mg via INTRAVENOUS
  Administered 2023-10-02: 60 mg via INTRAVENOUS
  Administered 2023-10-02: 10 mg via INTRAVENOUS

## 2023-10-02 MED ORDER — ONDANSETRON HCL 4 MG/2ML IJ SOLN
INTRAMUSCULAR | Status: DC | PRN
  Administered 2023-10-02: 4 mg via INTRAVENOUS

## 2023-10-02 MED ORDER — BUPIVACAINE-EPINEPHRINE 0.5% -1:200000 IJ SOLN
INTRAMUSCULAR | Status: DC | PRN
  Administered 2023-10-02: 50 mL via INTRAMUSCULAR

## 2023-10-02 MED ORDER — BUPIVACAINE-EPINEPHRINE (PF) 0.5% -1:200000 IJ SOLN
INTRAMUSCULAR | Status: AC
Start: 2023-10-02 — End: 2023-10-02
  Filled 2023-10-02: qty 30

## 2023-10-02 MED ORDER — ACETAMINOPHEN 500 MG PO TABS
1000.0000 mg | ORAL_TABLET | Freq: Four times a day (QID) | ORAL | Status: AC | PRN
Start: 2023-10-02 — End: ?

## 2023-10-02 MED ORDER — OXYCODONE HCL 5 MG PO TABS
5.0000 mg | ORAL_TABLET | ORAL | Status: AC | PRN
  Administered 2023-10-02: 5 mg via ORAL

## 2023-10-02 MED ORDER — FENTANYL CITRATE (PF) 100 MCG/2ML IJ SOLN
INTRAMUSCULAR | Status: AC
Start: 2023-10-02 — End: 2023-10-02
  Filled 2023-10-02: qty 2

## 2023-10-02 MED ORDER — IBUPROFEN 600 MG PO TABS: 600.0000 mg | ORAL_TABLET | Freq: Three times a day (TID) | ORAL | 1 refills | Status: AC | PRN

## 2023-10-02 MED ORDER — DEXAMETHASONE SODIUM PHOSPHATE 10 MG/ML IJ SOLN
INTRAMUSCULAR | Status: DC | PRN
  Administered 2023-10-02: 10 mg via INTRAVENOUS

## 2023-10-02 MED ORDER — PROPOFOL 10 MG/ML IV BOLUS
INTRAVENOUS | Status: DC | PRN
  Administered 2023-10-02: 120 mg via INTRAVENOUS

## 2023-10-02 MED ORDER — ACETAMINOPHEN 500 MG PO TABS
1000.0000 mg | ORAL_TABLET | ORAL | Status: AC
  Administered 2023-10-02: 1000 mg via ORAL

## 2023-10-02 MED ORDER — DEXMEDETOMIDINE HCL IN NACL 80 MCG/20ML IV SOLN
INTRAVENOUS | Status: DC | PRN
  Administered 2023-10-02 (×2): 8 ug via INTRAVENOUS

## 2023-10-02 MED ORDER — KETOROLAC TROMETHAMINE 30 MG/ML IJ SOLN
INTRAMUSCULAR | Status: DC | PRN
  Administered 2023-10-02: 30 mg via INTRAVENOUS

## 2023-10-02 MED ORDER — MIDAZOLAM HCL 2 MG/2ML IJ SOLN
INTRAMUSCULAR | Status: DC | PRN
  Administered 2023-10-02: 2 mg via INTRAVENOUS

## 2023-10-02 MED ORDER — OXYCODONE HCL 5 MG PO TABS: 5.0000 mg | ORAL_TABLET | ORAL | 0 refills | Status: DC | PRN

## 2023-10-02 MED ORDER — ORAL CARE MOUTH RINSE: 15.0000 mL | Freq: Once | OROMUCOSAL | Status: AC

## 2023-10-02 MED ORDER — OXYCODONE HCL 5 MG PO TABS
ORAL_TABLET | ORAL | Status: AC
  Filled 2023-10-02: qty 1

## 2023-10-02 MED ORDER — FENTANYL CITRATE (PF) 100 MCG/2ML IJ SOLN
INTRAMUSCULAR | Status: DC | PRN
  Administered 2023-10-02 (×2): 50 ug via INTRAVENOUS

## 2023-10-02 SURGICAL SUPPLY — 31 items
BLADE SURG 15 STRL LF DISP TIS (BLADE) ×2 IMPLANT
CHLORAPREP W/TINT 26 (MISCELLANEOUS) IMPLANT
DERMABOND ADVANCED .7 DNX12 (GAUZE/BANDAGES/DRESSINGS) ×2 IMPLANT
DRAIN PENROSE 12X.25 LTX STRL (MISCELLANEOUS) ×2 IMPLANT
DRAPE LAPAROTOMY 100X77 ABD (DRAPES) ×2 IMPLANT
ELECTRODE CAUTERY BLDE TIP 2.5 (TIP) ×2 IMPLANT
ELECTRODE REM PT RTRN 9FT ADLT (ELECTROSURGICAL) ×2 IMPLANT
GAUZE 4X4 16PLY ~~LOC~~+RFID DBL (SPONGE) IMPLANT
GLOVE SURG SYN 7.0 (GLOVE) ×4 IMPLANT
GLOVE SURG SYN 7.0 PF PI (GLOVE) ×2 IMPLANT
GLOVE SURG SYN 7.5 E (GLOVE) ×4 IMPLANT
GLOVE SURG SYN 7.5 PF PI (GLOVE) ×2 IMPLANT
GOWN STRL REUS W/ TWL LRG LVL3 (GOWN DISPOSABLE) ×4 IMPLANT
LABEL OR SOLS (LABEL) ×2 IMPLANT
MANIFOLD NEPTUNE II (INSTRUMENTS) ×2 IMPLANT
MESH HERNIA 1.6X1.9 PLUG LRG (Mesh General) IMPLANT
NDL HYPO 22X1.5 SAFETY MO (MISCELLANEOUS) ×2 IMPLANT
NEEDLE HYPO 22X1.5 SAFETY MO (MISCELLANEOUS) ×2 IMPLANT
NS IRRIG 500ML POUR BTL (IV SOLUTION) ×2 IMPLANT
PACK BASIN MINOR ARMC (MISCELLANEOUS) ×2 IMPLANT
SPONGE T-LAP 18X18 ~~LOC~~+RFID (SPONGE) ×2 IMPLANT
SUT PROLENE 2 0 SH DA (SUTURE) ×4 IMPLANT
SUT SILK 2 0 PERMA HAND 18 BK (SUTURE) IMPLANT
SUT SILK 3-0 18XBRD TIE 12 (SUTURE) IMPLANT
SUT VIC AB 2-0 CT1 (SUTURE) ×2 IMPLANT
SUT VIC AB 3-0 SH 27X BRD (SUTURE) ×2 IMPLANT
SUTURE MNCRL 4-0 27XMF (SUTURE) ×2 IMPLANT
SYR 10ML LL (SYRINGE) ×2 IMPLANT
SYR 30ML LL (SYRINGE) ×2 IMPLANT
TRAP FLUID SMOKE EVACUATOR (MISCELLANEOUS) ×2 IMPLANT
WATER STERILE IRR 500ML POUR (IV SOLUTION) ×2 IMPLANT

## 2023-10-02 NOTE — Op Note (Signed)
 Procedure Date:  10/02/2023  Pre-operative Diagnosis:  Bilateral inguinal hernia  Post-operative Diagnosis: Bilateral direct inguinal hernia  Procedure:  Open Bilateral Inguinal Hernia Repair  Surgeon:  [name], MD  Anesthesia:  General endotracheal  Estimated Blood Loss:  10 ml  Specimens:  None  Complications:  None  Indications for Procedure:  This is a 58 y.o. male who presents with bilateral inguinal hernia.  The options of surgery versus observation were reviewed with the patient and/or family. The risks of bleeding, abscess or infection, recurrence of symptoms, potential for an open procedure, injury to surrounding structures, and chronic pain were all discussed with the patient and was willing to proceed.  Description of Procedure: The patient was correctly identified in the preoperative area and brought into the operating room.  The patient was placed supine with VTE prophylaxis in place.  Appropriate time-outs were performed.  Anesthesia was induced and the patient was intubated.  Appropriate antibiotics were infused.  The lower abdomen and bilateral groin areas were prepped and draped in a sterile fashion. We started with the left side.  An oblique incision was made between the pubic symphysis extending laterally toward the ASIS. Using electrocautery, the subcutaneous tissues were dissected, assuring adequate hemostasis, until reaching the external oblique aponeurosis.  A 1 cm incision was made over the aponeurosis and extended laterally and medially toward the external inguinal ring avoiding injury to the ilioinguinal nerve.  The cord was encircled using a Penrose drain and using medial and lateral retraction, the cord structures were identified and the hernia sac was identified and dissected free.  This was a direct hernia.  The sac was pushed back into the peritoneal cavity.  A large Bard plug was then inserted into the direct space and secured with 2-0 Prolene to help  close the defect.  A Bard mesh was then placed and sutured to the pubic tubercle, shelving edge, and conjoined tendon with interrupted 2-0 Prolene sutures.  The tails of the mesh were crossed behind the cord and sutured together creating a new internal ring.  Local anesthetic was injected onto the subcutaneous tissue, the fascia, and as a left ilioinguinal block.  The external oblique was then closed in running fashion with 2-0 Vicryl, creating a new external ring.  The wound was irrigated and the incision was closed in three layers with 2-0 Vicryl, 3-0 Vicryl and 4-0 Monocryl.  The wound was cleaned and sealed with DermaBond.  We then moved to the right side.  An oblique incision was made between the pubic symphysis extending laterally toward the ASIS. Using electrocautery, the subcutaneous tissues were dissected, assuring adequate hemostasis, until reaching the external oblique aponeurosis.  A 1 cm incision was made over the aponeurosis and extended laterally and medially toward the external inguinal ring avoiding injury to the ilioinguinal nerve.  The cord was encircled using a Penrose drain and using medial and lateral retraction, the cord structures were identified and the hernia sac was identified and dissected free.  This was a larger direct hernia sac.  The sac was entered and freed of any contents carefully and then ligated with 2-0 Silk tie and resected in order to decrease the size of the sac going back to the abdominal cavity.  The stump was pushed back into the abdominal cavity.  A large Bard plug was then inserted into the direct space and secured with 2-0 Prolene to help close the defect.  A Bard mesh was then placed and sutured to the  pubic tubercle, shelving edge, and conjoined tendon with interrupted 2-0 Prolene sutures.  The tails of the mesh were crossed behind the cord and sutured together creating a new internal ring.  Local anesthetic was injected onto the subcutaneous tissue, the fascia,  and as a left ilioinguinal block.  The external oblique was then closed in running fashion with 2-0 Vicryl, creating a new external ring.  The wound was irrigated and the incision was closed in three layers with 2-0 Vicryl, 3-0 Vicryl and 4-0 Monocryl.  The wound was cleaned and sealed with DermaBond.  The patient was emerged from anesthesia and extubated and brought to the recovery room for further management.  The patient tolerated the procedure well and all counts were correct at the end of the case.   [name], MD

## 2023-10-02 NOTE — Interval H&P Note (Signed)
 History and Physical Interval Note:  10/02/2023 7:06 AM  Jonathon Hunt  has presented today for surgery, with the diagnosis of bilateral inguinal hernia.  The various methods of treatment have been discussed with the patient and family. After consideration of risks, benefits and other options for treatment, the patient has consented to  Procedure(s) with comments: REPAIR, HERNIA, INGUINAL, ADULT (Bilateral) - open as a surgical intervention.  The patient's history has been reviewed, patient examined, no change in status, stable for surgery.  I have reviewed the patient's chart and labs.  Questions were answered to the patient's satisfaction.     [name]

## 2023-10-02 NOTE — Discharge Instructions (Addendum)
 Discharge Instructions: 1.  Patient may shower, but do not scrub wounds heavily and dab dry only. 2.  Do not submerge wounds in pool/tub until fully healed. 3.  Do not apply ointments or hydrogen peroxide to the wounds. 4.  May apply ice packs to the wounds for comfort. 5.  It is normal for there to be swelling or bruising at the groin and scrotum areas after this surgery.  This will resolve on its own. 6.  Do not drive while taking narcotics for pain control.  Prior to driving, make sure you are able to rotate right and left to look at blindspots without significant pain or discomfort. 7.  No heavy lifting or pushing of more than 10-15 lbs for 6 weeks.

## 2023-10-02 NOTE — Anesthesia Preprocedure Evaluation (Signed)
 Anesthesia Evaluation  Patient identified by MRN, date of birth, ID band Patient awake    Reviewed: Allergy & Precautions, H&P , NPO status , Patient's Chart, lab work & pertinent test results  Airway Mallampati: II  TM Distance: >3 FB Neck ROM: Full    Dental no notable dental hx. (+) Poor Dentition, Loose, Missing, Chipped Very loose left lower central incisor, patient states, I hope you can remove it. Multiple missing upper and some lower, very poor dentition:   Pulmonary neg pulmonary ROS, pneumonia, Current Smoker and Patient abstained from smoking.   Pulmonary exam normal breath sounds clear to auscultation       Cardiovascular hypertension, negative cardio ROS Normal cardiovascular exam Rhythm:Regular Rate:Normal     Neuro/Psych  PSYCHIATRIC DISORDERS       Neuromuscular disease negative neurological ROS  negative psych ROS   GI/Hepatic negative GI ROS, Neg liver ROS,,,  Endo/Other  negative endocrine ROS    Renal/GU negative Renal ROS  negative genitourinary   Musculoskeletal negative musculoskeletal ROS (+) Arthritis ,    Abdominal   Peds negative pediatric ROS (+)  Hematology negative hematology ROS (+)   Anesthesia Other Findings   Medical History  Hypertension  Pneumonia EtOH dependence (HCC)  H/O ETOH abuse Direct hernia  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  Arthropathy of lumbar facet joint Lumbar spondylosis  Sacroiliac joint pain Bilateral recurrent inguinal hernia without obstruction or gangrene  Prostate Cancer (HCC)    Reproductive/Obstetrics negative OB ROS                              Anesthesia Physical Anesthesia Plan  ASA: 3  Anesthesia Plan: General ETT   Post-op Pain Management:    Induction: Intravenous  PONV Risk Score and Plan:   Airway Management Planned: Oral  ETT  Additional Equipment:   Intra-op Plan:   Post-operative Plan: Extubation in OR  Informed Consent: I have reviewed the patients History and Physical, chart, labs and discussed the procedure including the risks, benefits and alternatives for the proposed anesthesia with the patient or authorized representative who has indicated his/her understanding and acceptance.     Dental Advisory Given  Plan Discussed with: Anesthesiologist, CRNA and Surgeon  Anesthesia Plan Comments: (Patient consented for risks of anesthesia including but not limited to:  - adverse reactions to medications - damage to eyes, teeth, lips or other oral mucosa - nerve damage due to positioning  - sore throat or hoarseness - Damage to heart, brain, nerves, lungs, other parts of body or loss of life  Patient voiced understanding and assent.)         Anesthesia Quick Evaluation

## 2023-10-02 NOTE — Transfer of Care (Signed)
 Immediate Anesthesia Transfer of Care Note  Patient: Jonathon Hunt  Procedure(s) Performed: REPAIR, HERNIA, INGUINAL, ADULT (Bilateral: Inguinal) INSERTION OF MESH  Patient Location: PACU  Anesthesia Type:General  Level of Consciousness: drowsy  Airway & Oxygen Therapy: Patient Spontanous Breathing and Patient connected to face mask oxygen  Post-op Assessment: Report given to RN and Post -op Vital signs reviewed and stable  Post vital signs: Reviewed and stable  Last Vitals:  Vitals Value Taken Time  BP 126/90 10/02/23 10:36  Temp 35.9 1037  Pulse 80 10/02/23 10:42  Resp 15 10/02/23 10:42  SpO2 99 % 10/02/23 10:42  Vitals shown include unfiled device data.  Last Pain:  Vitals:   10/02/23 0622  PainSc: 0-No pain         Complications: No notable events documented.

## 2023-10-02 NOTE — Anesthesia Procedure Notes (Signed)
 Procedure Name: Intubation Date/Time: 10/02/2023 7:44 AM  Performed by: Jackye Spanner, CRNAPre-anesthesia Checklist: Patient identified, Patient being monitored, Timeout performed, Emergency Drugs available and Suction available Patient Re-evaluated:Patient Re-evaluated prior to induction Oxygen Delivery Method: Circle system utilized Preoxygenation: Pre-oxygenation with 100% oxygen Induction Type: IV induction Ventilation: Mask ventilation without difficulty Laryngoscope Size: 3 and McGrath Grade View: Grade I Tube type: Oral Tube size: 7.0 mm Number of attempts: 1 Airway Equipment and Method: Stylet Placement Confirmation: ETT inserted through vocal cords under direct vision, positive ETCO2 and breath sounds checked- equal and bilateral Secured at: 23 cm Tube secured with: Tape Dental Injury: Teeth and Oropharynx as per pre-operative assessment  Comments: Smooth atraumatic intubation, no complications noted.

## 2023-10-02 NOTE — Anesthesia Postprocedure Evaluation (Signed)
 Anesthesia Post Note  Patient: Merlen K Inzunza  Procedure(s) Performed: REPAIR, HERNIA, INGUINAL, ADULT (Bilateral: Inguinal) INSERTION OF MESH  Patient location during evaluation: PACU Anesthesia Type: General Level of consciousness: awake and alert Pain management: pain level controlled Vital Signs Assessment: post-procedure vital signs reviewed and stable Respiratory status: spontaneous breathing, nonlabored ventilation, respiratory function stable and patient connected to nasal cannula oxygen Cardiovascular status: blood pressure returned to baseline and stable Postop Assessment: no apparent nausea or vomiting Anesthetic complications: no   No notable events documented.   Last Vitals:  Vitals:   10/02/23 1115 10/02/23 1121  BP: 128/88   Pulse: 84 78  Resp: (!) 22 18  Temp:  (!) 36.2 C  SpO2: 95% 97%    Last Pain:  Vitals:   10/02/23 1108  PainSc: 5                  Makyla Bye C Derius Ghosh

## 2023-10-03 ENCOUNTER — Encounter: Payer: Self-pay | Admitting: Surgery

## 2023-10-04 ENCOUNTER — Other Ambulatory Visit: Payer: Self-pay | Admitting: Surgery

## 2023-10-04 MED ORDER — OXYCODONE HCL 5 MG PO TABS: 5.0000 mg | ORAL_TABLET | ORAL | 0 refills | Status: DC | PRN

## 2023-10-08 ENCOUNTER — Telehealth: Payer: Self-pay | Admitting: *Deleted

## 2023-10-08 NOTE — Telephone Encounter (Signed)
 Faxed FMLA to [facility] [employer] at [phone], copy also left up front for patient to pick up

## 2023-10-17 ENCOUNTER — Ambulatory Visit (INDEPENDENT_AMBULATORY_CARE_PROVIDER_SITE_OTHER): Admitting: Surgery

## 2023-10-17 ENCOUNTER — Encounter: Payer: Self-pay | Admitting: Surgery

## 2023-10-17 VITALS — BP 133/89 | HR 91 | Temp 97.7°F | Ht 69.0 in | Wt 166.6 lb

## 2023-10-17 DIAGNOSIS — K4020 Bilateral inguinal hernia, without obstruction or gangrene, not specified as recurrent: Secondary | ICD-10-CM

## 2023-10-17 DIAGNOSIS — Z09 Encounter for follow-up examination after completed treatment for conditions other than malignant neoplasm: Secondary | ICD-10-CM

## 2023-10-17 NOTE — Progress Notes (Signed)
 10/17/2023  HPI: Jonathon Hunt is a 58 y.o. male s/p open bilateral inguinal hernia repair on 10/02/2023.  Patient presents today for follow-up.  He reports that he still having some soreness over bilateral groin areas and particularly a burning sensation in the right lower groin.  However he reports that overall the soreness is improving.  Vital signs: BP 133/89   Pulse 91   Temp 97.7 F (36.5 C) (Oral)   Ht 5' 9 (1.753 m)   Wt 166 lb 9.6 oz (75.6 kg)   SpO2 100%   BMI 24.60 kg/m    Physical Exam: Constitutional: No acute distress Abdomen: Soft, nondistended, appropriately sore to palpation.  Patient has bilateral inguinal incisions that are healing well and are clean, dry, intact with some expected swelling on both sides.  On the left side, the patient has what looks like a seroma and on the right side he has a palpable cord likely from scar tissue within the inguinal canal.  No evidence of hernia recurrence on either side with coughing or Valsalva.  Assessment/Plan: This is a 58 y.o. male s/p open bilateral inguinal hernia repair  - Discussed with patient that the swelling and seromas/scar tissue is very normal to have after open inguinal hernia surgery.  This will continue to improve with time.  Although there may be residual smaller seroma or smaller fragments from the scar tissue, this should improve compared to what it currently is. - Discussed with him again activity restrictions for total of 6 weeks. - Encouraged the patient to get back in touch with urology for workup and management of his prostate cancer.  The patient tells me that he wants to get a second opinion.  Advised him to contact his PCP to do a new referral to urology. - Follow-up in 4 weeks.   [name], MD [facility]

## 2023-10-17 NOTE — Patient Instructions (Signed)

## 2023-11-12 ENCOUNTER — Ambulatory Visit (INDEPENDENT_AMBULATORY_CARE_PROVIDER_SITE_OTHER): Admitting: Surgery

## 2023-11-12 ENCOUNTER — Encounter: Payer: Self-pay | Admitting: Surgery

## 2023-11-12 VITALS — BP 128/84 | HR 96 | Temp 98.4°F | Ht 69.0 in | Wt 166.8 lb

## 2023-11-12 DIAGNOSIS — K4020 Bilateral inguinal hernia, without obstruction or gangrene, not specified as recurrent: Secondary | ICD-10-CM

## 2023-11-12 DIAGNOSIS — Z09 Encounter for follow-up examination after completed treatment for conditions other than malignant neoplasm: Secondary | ICD-10-CM

## 2023-11-12 NOTE — Progress Notes (Signed)
 11/12/2023  HPI: Jonathon Hunt is a 58 y.o. male s/p open bilateral inguinal hernia repair on 10/02/2023.  Patient presents today for follow-up.  On his last visit on 10/17/2023 patient was noted to have a left seroma in her right side firmness area at the cord.  Today, the patient reports that he has been doing well and denies any pain.  Does report some tightness depending how he is moving on both groin areas but denies otherwise any issues.  He has not asked for a referral for urology yet as he wanted to be done with the groin issues first.  Vital signs: BP 128/84   Pulse 96   Temp 98.4 F (36.9 C) (Oral)   Ht 5' 9 (1.753 m)   Wt 166 lb 12.8 oz (75.7 kg)   SpO2 98%   BMI 24.63 kg/m    Physical Exam: Constitutional: No acute distress Abdomen: Soft, nondistended, nontender to palpation.  Bilateral inguinal incisions are well-healed and are clean, dry, intact.  There is still some residual firmness from scarring but this is better than last time.  Left seroma is now resolved.  No evidence of hernia recurrence on either side.  Assessment/Plan: This is a 58 y.o. male s/p open bilateral inguinal hernia repair.  - The patient is now 6 weeks from his surgery.  Both incisions have healed well and the amount of firmness and swelling is much improved.  There is no residual seroma anymore.  There is no evidence of hernia recurrence. - Discussed with patient that the tightness is related to the scar tissue that is formed and at this point he can start doing some stretching exercises to help loosen some of the stiffness of the scar.  He might always feel some degree of tightness depending how he moves. - From a surgical standpoint, [place] with patient that there is no contraindication for him to seek attention for his prostate cancer.   [name], MD [facility]

## 2023-11-12 NOTE — Patient Instructions (Signed)

## 2023-11-16 ENCOUNTER — Ambulatory Visit: Payer: Self-pay

## 2023-11-16 NOTE — Telephone Encounter (Signed)
 FYI Only or Action Required?: FYI only for provider.  Patient was last seen in primary care on 09/21/2023 by Herold Hadassah SQUIBB, MD.  Called Nurse Triage reporting Cough.  Symptoms began a week ago.  Interventions attempted: OTC medications: cough drops.  Symptoms are: gradually worsening.  Triage Disposition: See Physician Within 24 Hours- no appts avail, RN advising Urgent Care. Pt agreeable.   Patient/caregiver understands and will follow disposition?: Yes Copied from CRM [id number]. Topic: Clinical - Red Word Triage >> Nov 16, 2023  9:30 AM Jonathon Hunt wrote: Red Word that prompted transfer to Nurse Triage: cough, sore on left side, some SOB with cough Reason for Disposition  Cough  [1] Continuous (nonstop) coughing interferes with work or school AND [2] no improvement using cough treatment per Care Advice  Answer Assessment - Initial Assessment Questions 1. ONSET: When did the cough begin?      Started 1 week ago  2. SEVERITY: How bad is the cough today?      Left side, in rib area has some pain  3. SPUTUM: Describe the color of your sputum (e.g., none, dry cough; clear, white, yellow, green)     Dry cough  4. HEMOPTYSIS: Are you coughing up any blood? If Yes, ask: How much? (e.g., flecks, streaks, tablespoons, etc.)     No  5. DIFFICULTY BREATHING: Are you having difficulty breathing? If Yes, ask: How bad is it? (e.g., mild, moderate, severe)      Mild, but it feels like congestion  6. FEVER: Do you have a fever? If Yes, ask: What is your temperature, how was it measured, and when did it start?     No  7. CARDIAC HISTORY: Do you have any history of heart disease? (e.g., heart attack, congestive heart failure)      No  8. LUNG HISTORY: Do you have any history of lung disease?  (e.g., pulmonary embolus, asthma, emphysema)     No  9. PE RISK FACTORS: Do you have a history of blood clots? (or: recent major surgery, recent prolonged travel, bedridden)      Recent surgery  10. OTHER SYMPTOMS: Do you have any other symptoms? (e.g., runny nose, wheezing, chest pain)       *No Answer* 11. PREGNANCY: Is there any chance you are pregnant? When was your last menstrual period?       N/a  12. TRAVEL: Have you traveled out of the country in the last month? (e.g., travel history, exposures)       *No Answer*  Protocols used: Cough - Acute Productive-A-AH

## 2023-12-14 ENCOUNTER — Encounter: Admitting: Pediatrics
